# Patient Record
Sex: Male | Born: 1940 | Race: White | Hispanic: No | Marital: Single | Smoking: Former smoker
Health system: Southern US, Community
[De-identification: ages and names within clinical notes are randomized; demographics above are authoritative.]

## PROBLEM LIST (undated history)

## (undated) DIAGNOSIS — E785 Hyperlipidemia, unspecified: Secondary | ICD-10-CM

## (undated) DIAGNOSIS — Z85828 Personal history of other malignant neoplasm of skin: Secondary | ICD-10-CM

## (undated) DIAGNOSIS — Z7289 Other problems related to lifestyle: Secondary | ICD-10-CM

## (undated) DIAGNOSIS — I674 Hypertensive encephalopathy: Secondary | ICD-10-CM

## (undated) DIAGNOSIS — I10 Essential (primary) hypertension: Secondary | ICD-10-CM

## (undated) DIAGNOSIS — Z800 Family history of malignant neoplasm of digestive organs: Secondary | ICD-10-CM

## (undated) DIAGNOSIS — Z860100 Personal history of colon polyps, unspecified: Secondary | ICD-10-CM

## (undated) DIAGNOSIS — K5790 Diverticulosis of intestine, part unspecified, without perforation or abscess without bleeding: Secondary | ICD-10-CM

## (undated) DIAGNOSIS — Z8546 Personal history of malignant neoplasm of prostate: Secondary | ICD-10-CM

## (undated) DIAGNOSIS — I2510 Atherosclerotic heart disease of native coronary artery without angina pectoris: Secondary | ICD-10-CM

## (undated) DIAGNOSIS — F1090 Alcohol use, unspecified, uncomplicated: Secondary | ICD-10-CM

## (undated) HISTORY — DX: Hyperlipidemia, unspecified: E78.5

## (undated) HISTORY — DX: Essential (primary) hypertension: I10

## (undated) HISTORY — DX: Atherosclerotic heart disease of native coronary artery without angina pectoris: I25.10

## (undated) HISTORY — DX: Hypertensive encephalopathy: I67.4

## (undated) HISTORY — DX: Personal history of colon polyps, unspecified: Z86.0100

## (undated) HISTORY — DX: Personal history of malignant neoplasm of prostate: Z85.46

## (undated) HISTORY — DX: Diverticulosis of intestine, part unspecified, without perforation or abscess without bleeding: K57.90

## (undated) HISTORY — DX: Personal history of colonic polyps: Z86.010

## (undated) HISTORY — DX: Personal history of other malignant neoplasm of skin: Z85.828

## (undated) HISTORY — PX: COLONOSCOPY W/ POLYPECTOMY: SHX1380

## (undated) HISTORY — PX: TONSILLECTOMY: SUR1361

## (undated) HISTORY — DX: Family history of malignant neoplasm of digestive organs: Z80.0

---

## 2000-08-15 HISTORY — PX: PROSTATECTOMY: SHX69

## 2000-09-08 ENCOUNTER — Encounter (INDEPENDENT_AMBULATORY_CARE_PROVIDER_SITE_OTHER): Payer: Self-pay | Admitting: Specialist

## 2000-09-08 ENCOUNTER — Other Ambulatory Visit: Admission: RE | Admit: 2000-09-08 | Discharge: 2000-09-08 | Payer: Self-pay | Admitting: Urology

## 2000-12-11 ENCOUNTER — Encounter: Payer: Self-pay | Admitting: Urology

## 2000-12-18 ENCOUNTER — Inpatient Hospital Stay (HOSPITAL_COMMUNITY): Admission: RE | Admit: 2000-12-18 | Discharge: 2000-12-21 | Payer: Self-pay | Admitting: Urology

## 2000-12-18 ENCOUNTER — Encounter (INDEPENDENT_AMBULATORY_CARE_PROVIDER_SITE_OTHER): Payer: Self-pay

## 2002-07-02 ENCOUNTER — Ambulatory Visit: Admission: RE | Admit: 2002-07-02 | Discharge: 2002-09-19 | Payer: Self-pay | Admitting: Radiation Oncology

## 2003-07-03 ENCOUNTER — Encounter: Payer: Self-pay | Admitting: Internal Medicine

## 2003-07-04 ENCOUNTER — Encounter: Payer: Self-pay | Admitting: Internal Medicine

## 2004-08-18 ENCOUNTER — Ambulatory Visit: Payer: Self-pay | Admitting: Internal Medicine

## 2004-08-23 ENCOUNTER — Ambulatory Visit: Payer: Self-pay

## 2004-12-16 ENCOUNTER — Ambulatory Visit: Payer: Self-pay | Admitting: Internal Medicine

## 2005-12-07 ENCOUNTER — Ambulatory Visit: Payer: Self-pay | Admitting: Internal Medicine

## 2006-12-13 ENCOUNTER — Ambulatory Visit: Payer: Self-pay | Admitting: Internal Medicine

## 2006-12-26 ENCOUNTER — Ambulatory Visit: Payer: Self-pay | Admitting: Internal Medicine

## 2006-12-26 LAB — CONVERTED CEMR LAB
AST: 30 units/L (ref 0–37)
Albumin: 4.1 g/dL (ref 3.5–5.2)
Basophils Relative: 0.6 % (ref 0.0–1.0)
Bilirubin, Direct: 0.2 mg/dL (ref 0.0–0.3)
Chloride: 106 meq/L (ref 96–112)
Creatinine, Ser: 1.1 mg/dL (ref 0.4–1.5)
Eosinophils Relative: 4.8 % (ref 0.0–5.0)
Glucose, Bld: 94 mg/dL (ref 70–99)
HCT: 43.8 % (ref 39.0–52.0)
Hgb A1c MFr Bld: 5.2 % (ref 4.6–6.0)
Neutrophils Relative %: 48.7 % (ref 43.0–77.0)
RBC: 4.71 M/uL (ref 4.22–5.81)
RDW: 12.4 % (ref 11.5–14.6)
Sodium: 142 meq/L (ref 135–145)
Total Bilirubin: 1.1 mg/dL (ref 0.3–1.2)
Total CHOL/HDL Ratio: 4.7
Triglycerides: 108 mg/dL (ref 0–149)
WBC: 4.3 10*3/uL — ABNORMAL LOW (ref 4.5–10.5)

## 2007-04-17 ENCOUNTER — Ambulatory Visit: Payer: Self-pay | Admitting: Gastroenterology

## 2007-04-30 ENCOUNTER — Ambulatory Visit: Payer: Self-pay | Admitting: Gastroenterology

## 2007-04-30 ENCOUNTER — Encounter: Payer: Self-pay | Admitting: Internal Medicine

## 2008-07-17 ENCOUNTER — Ambulatory Visit: Payer: Self-pay | Admitting: Internal Medicine

## 2008-07-17 DIAGNOSIS — Z85828 Personal history of other malignant neoplasm of skin: Secondary | ICD-10-CM

## 2008-07-17 DIAGNOSIS — I119 Hypertensive heart disease without heart failure: Secondary | ICD-10-CM

## 2008-07-17 DIAGNOSIS — Z8546 Personal history of malignant neoplasm of prostate: Secondary | ICD-10-CM | POA: Insufficient documentation

## 2008-07-17 DIAGNOSIS — Z86010 Personal history of colonic polyps: Secondary | ICD-10-CM

## 2008-07-17 DIAGNOSIS — E785 Hyperlipidemia, unspecified: Secondary | ICD-10-CM | POA: Insufficient documentation

## 2008-07-17 LAB — CONVERTED CEMR LAB: LDL Goal: 130 mg/dL

## 2008-07-18 ENCOUNTER — Encounter: Payer: Self-pay | Admitting: Internal Medicine

## 2008-07-24 ENCOUNTER — Ambulatory Visit: Payer: Self-pay | Admitting: Internal Medicine

## 2008-07-30 LAB — CONVERTED CEMR LAB
ALT: 26 units/L (ref 0–53)
Albumin: 4.1 g/dL (ref 3.5–5.2)
Direct LDL: 156.8 mg/dL
HDL: 47.8 mg/dL (ref >39.0)
Hgb A1c MFr Bld: 5.4 % (ref 4.6–6.0)
Potassium: 4.3 meq/L (ref 3.5–5.1)
Total Bilirubin: 0.9 mg/dL (ref 0.3–1.2)
Triglycerides: 70 mg/dL (ref 0–149)

## 2008-07-31 ENCOUNTER — Telehealth (INDEPENDENT_AMBULATORY_CARE_PROVIDER_SITE_OTHER): Payer: Self-pay | Admitting: *Deleted

## 2008-07-31 ENCOUNTER — Encounter (INDEPENDENT_AMBULATORY_CARE_PROVIDER_SITE_OTHER): Payer: Self-pay | Admitting: *Deleted

## 2008-08-07 ENCOUNTER — Ambulatory Visit: Payer: Self-pay | Admitting: Internal Medicine

## 2008-08-07 LAB — CONVERTED CEMR LAB
Fecal Occult Blood: NEGATIVE
OCCULT 3: NEGATIVE
OCCULT 4: NEGATIVE
OCCULT 5: NEGATIVE

## 2008-08-11 ENCOUNTER — Encounter (INDEPENDENT_AMBULATORY_CARE_PROVIDER_SITE_OTHER): Payer: Self-pay | Admitting: *Deleted

## 2008-08-15 HISTORY — PX: CATARACT EXTRACTION: SUR2

## 2008-10-02 ENCOUNTER — Ambulatory Visit: Payer: Self-pay | Admitting: Internal Medicine

## 2008-10-19 LAB — CONVERTED CEMR LAB
ALT: 28 units/L (ref 0–53)
AST: 35 units/L (ref 0–37)
Alkaline Phosphatase: 63 units/L (ref 39–117)
Bilirubin, Direct: 0.1 mg/dL (ref 0.0–0.3)
Cholesterol: 177 mg/dL (ref 0–200)
Total Protein: 6.9 g/dL (ref 6.0–8.3)

## 2008-10-20 ENCOUNTER — Encounter (INDEPENDENT_AMBULATORY_CARE_PROVIDER_SITE_OTHER): Payer: Self-pay | Admitting: *Deleted

## 2009-08-01 ENCOUNTER — Observation Stay (HOSPITAL_COMMUNITY): Admission: EM | Admit: 2009-08-01 | Discharge: 2009-08-03 | Payer: Self-pay | Admitting: Emergency Medicine

## 2009-08-03 ENCOUNTER — Ambulatory Visit: Payer: Self-pay | Admitting: Vascular Surgery

## 2009-08-03 ENCOUNTER — Encounter (INDEPENDENT_AMBULATORY_CARE_PROVIDER_SITE_OTHER): Payer: Self-pay | Admitting: Internal Medicine

## 2009-08-04 ENCOUNTER — Ambulatory Visit: Payer: Self-pay | Admitting: Internal Medicine

## 2009-08-04 DIAGNOSIS — I674 Hypertensive encephalopathy: Secondary | ICD-10-CM

## 2009-09-07 ENCOUNTER — Ambulatory Visit: Payer: Self-pay | Admitting: Internal Medicine

## 2009-09-11 LAB — CONVERTED CEMR LAB
AST: 30 units/L (ref 0–37)
Cholesterol: 162 mg/dL (ref 0–200)
LDL Cholesterol: 104 mg/dL — ABNORMAL HIGH (ref 0–99)
Triglycerides: 98 mg/dL (ref 0.0–149.0)
VLDL: 19.6 mg/dL (ref 0.0–40.0)

## 2009-09-14 ENCOUNTER — Ambulatory Visit: Payer: Self-pay | Admitting: Internal Medicine

## 2010-03-15 ENCOUNTER — Ambulatory Visit: Payer: Self-pay | Admitting: Internal Medicine

## 2010-08-24 ENCOUNTER — Ambulatory Visit
Admission: RE | Admit: 2010-08-24 | Discharge: 2010-08-24 | Payer: Self-pay | Source: Home / Self Care | Attending: Internal Medicine | Admitting: Internal Medicine

## 2010-08-24 ENCOUNTER — Other Ambulatory Visit: Payer: Self-pay | Admitting: Internal Medicine

## 2010-08-24 LAB — CBC WITH DIFFERENTIAL/PLATELET
Basophils Absolute: 0 10*3/uL (ref 0.0–0.1)
Basophils Relative: 0.8 % (ref 0.0–3.0)
Eosinophils Absolute: 0.2 10*3/uL (ref 0.0–0.7)
Eosinophils Relative: 4.3 % (ref 0.0–5.0)
HCT: 43.7 % (ref 39.0–52.0)
Hemoglobin: 14.8 g/dL (ref 13.0–17.0)
Lymphocytes Relative: 35 % (ref 12.0–46.0)
Lymphs Abs: 1.5 10*3/uL (ref 0.7–4.0)
MCHC: 33.8 g/dL (ref 30.0–36.0)
MCV: 95.2 fl (ref 78.0–100.0)
Monocytes Absolute: 0.4 10*3/uL (ref 0.1–1.0)
Monocytes Relative: 8.4 % (ref 3.0–12.0)
Neutro Abs: 2.3 10*3/uL (ref 1.4–7.7)
Neutrophils Relative %: 51.5 % (ref 43.0–77.0)
Platelets: 215 10*3/uL (ref 150.0–400.0)
RBC: 4.59 Mil/uL (ref 4.22–5.81)
RDW: 13.4 % (ref 11.5–14.6)
WBC: 4.4 10*3/uL — ABNORMAL LOW (ref 4.5–10.5)

## 2010-08-24 LAB — LIPID PANEL
Cholesterol: 197 mg/dL (ref 0–200)
HDL: 59.5 mg/dL (ref >39.00)
LDL Cholesterol: 122 mg/dL — ABNORMAL HIGH (ref 0–99)
Total CHOL/HDL Ratio: 3
Triglycerides: 80 mg/dL (ref 0.0–149.0)
VLDL: 16 mg/dL (ref 0.0–40.0)

## 2010-08-24 LAB — CONVERTED CEMR LAB
Blood in Urine, dipstick: NEGATIVE
Protein, U semiquant: NEGATIVE
Urobilinogen, UA: 0.2
WBC Urine, dipstick: NEGATIVE

## 2010-08-24 LAB — BASIC METABOLIC PANEL
BUN: 23 mg/dL (ref 6–23)
CO2: 30 mEq/L (ref 19–32)
Calcium: 9 mg/dL (ref 8.4–10.5)
Chloride: 105 mEq/L (ref 96–112)
Creatinine, Ser: 0.9 mg/dL (ref 0.4–1.5)
GFR: 87.91 mL/min (ref >60.00)
Glucose, Bld: 91 mg/dL (ref 70–99)
Potassium: 5.3 mEq/L — ABNORMAL HIGH (ref 3.5–5.1)
Sodium: 143 mEq/L (ref 135–145)

## 2010-08-24 LAB — HEPATIC FUNCTION PANEL
ALT: 21 U/L (ref 0–53)
AST: 29 U/L (ref 0–37)
Albumin: 4 g/dL (ref 3.5–5.2)
Alkaline Phosphatase: 69 U/L (ref 39–117)
Bilirubin, Direct: 0.1 mg/dL (ref 0.0–0.3)
Total Bilirubin: 0.8 mg/dL (ref 0.3–1.2)
Total Protein: 6.7 g/dL (ref 6.0–8.3)

## 2010-08-24 LAB — TSH: TSH: 1.54 u[IU]/mL (ref 0.35–5.50)

## 2010-08-24 LAB — PSA: PSA: 0 ng/mL — ABNORMAL LOW (ref 0.10–4.00)

## 2010-08-31 ENCOUNTER — Ambulatory Visit
Admission: RE | Admit: 2010-08-31 | Discharge: 2010-08-31 | Payer: Self-pay | Source: Home / Self Care | Attending: Internal Medicine | Admitting: Internal Medicine

## 2010-08-31 ENCOUNTER — Encounter: Payer: Self-pay | Admitting: Internal Medicine

## 2010-08-31 DIAGNOSIS — K5730 Diverticulosis of large intestine without perforation or abscess without bleeding: Secondary | ICD-10-CM | POA: Insufficient documentation

## 2010-09-14 NOTE — Assessment & Plan Note (Signed)
Summary: TO DISCUSS LABS//PH   Vital Signs:  Patient profile:   70 year old male Weight:      222.8 pounds Pulse rate:   60 / minute Resp:     15 per minute BP sitting:   122 / 74  (left arm) Cuff size:   large  Vitals Entered By: Shonna Chock (September 14, 2009 11:06 AM) CC: Follow-up visit: Discuss Labs(copy given), Lipid Management, Hypertension Management Comments REVIEWED MED LIST, PATIENT AGREED DOSE AND INSTRUCTION CORRECT    CC:  Follow-up visit: Discuss Labs(copy given), Lipid Management, and Hypertension Management.  History of Present Illness: Labs reviewed & risks discussed. BP is progressively dropping, but 140/77 range @ home. Weight down 15# with decreased alcohol & decreased calories.  CVE 5X/week as walking 4 mpd or [employer] machines for 35-45 min  w/o symptoms.  Hypertension History:      He denies headache, chest pain, palpitations, dyspnea with exertion, orthopnea, PND, peripheral edema, visual symptoms, neurologic problems, syncope, and side effects from treatment.  He notes no problems with any antihypertensive medication side effects.  Cataract will need to be addressed.        Positive major cardiovascular risk factors include male age 77 years old or older, hyperlipidemia, and hypertension.  Negative major cardiovascular risk factors include no history of diabetes, negative family history for ischemic heart disease, and non-tobacco-user status.        Further assessment for target organ damage reveals no history of ASHD, stroke/TIA, or peripheral vascular disease.    Lipid Management History:      Positive NCEP/ATP III risk factors include male age 53 years old or older, HDL cholesterol less than 40, and hypertension.  Negative NCEP/ATP III risk factors include non-diabetic, no family history for ischemic heart disease, non-tobacco-user status, no ASHD (atherosclerotic heart disease), no prior stroke/TIA, no peripheral vascular disease, and no history of  aortic aneurysm.      Allergies (verified): No Known Drug Allergies  Review of Systems Eyes:  Complains of vision loss-1 eye; denies blurring and double vision. Neuro:  Denies numbness and tingling.  Physical Exam  General:  well-nourished, alert,appropriate and cooperative throughout examination Heart:  Normal rate and regular rhythm. S1 and S2 normal without gallop, murmur, click, rub. Pulses:  R and L carotid,radial,dorsalis pedis and posterior tibial pulses are full and equal bilaterally Neurologic:  alert & oriented X3.   Psych:  Focused & intelligent   Impression & Recommendations:  Problem # 1:  HYPERTENSION (ICD-401.9) BP control improved His updated medication list for this problem includes:    Metoprolol Tartrate 100 Mg Tabs (Metoprolol tartrate) .Marland Kitchen... 1/2  two times a day    Losartan Potassium 100 Mg Tabs (Losartan potassium) .Marland Kitchen... 1 once daily  Problem # 2:  HYPERLIPIDEMIA (ICD-272.4) LDL @ goal His updated medication list for this problem includes:    Pravastatin Sodium 20 Mg Tabs (Pravastatin sodium) .Marland Kitchen... 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 1/2  two times a day 2)  Pravastatin Sodium 20 Mg Tabs (Pravastatin sodium) .Marland Kitchen.. 1 qhs 3)  Losartan Potassium 100 Mg Tabs (Losartan potassium) .Marland Kitchen.. 1 once daily  Hypertension Assessment/Plan:      The patient's hypertensive risk group is category B: At least one risk factor (excluding diabetes) with no target organ damage.  His calculated 10 year risk of coronary heart disease is 18 %.  Today's blood pressure is 122/74.    Lipid Assessment/Plan:      Based on NCEP/ATP III, the patient's risk factor category is "2 or more risk factors and a calculated 10 year CAD risk of < 20%".  The patient's lipid goals are as follows: Total cholesterol goal is 200; LDL cholesterol goal is 130; HDL cholesterol goal  is 40; Triglyceride goal is 150.  His LDL cholesterol goal has been met.  Secondary causes for hyperlipidemia have been ruled out.  He has been counseled on adjunctive measures for lowering his cholesterol and has been provided with dietary instructions.    Patient Instructions: 1)  Please schedule a follow-up appointment in 6 months. 2)  NMR Lipoprofile Lipid Panel prior to visit, ICD-9:272.4 3)  HbgA1C prior to visit, ICD-9:272.4 Prescriptions: LOSARTAN POTASSIUM 100 MG TABS (LOSARTAN POTASSIUM) 1 once daily  #90 x 1   Entered and Authorized by:   Marga Melnick MD   Signed by:   Marga Melnick MD on 09/14/2009   Method used:   Print then Give to Patient   RxID:   [id number] PRAVASTATIN SODIUM 20 MG TABS (PRAVASTATIN SODIUM) 1 qhs  #90 Tablet x 3   Entered and Authorized by:   Marga Melnick MD   Signed by:   Marga Melnick MD on 09/14/2009   Method used:   Print then Give to Patient   RxID:   [id number]

## 2010-09-16 NOTE — Assessment & Plan Note (Signed)
Summary: CPX,LABS PRIOR,MEDICARE & UHC/RH.....   Vital Signs:  Patient profile:   70 year old male Height:      68 inches Weight:      221.2 pounds BMI:     33.75 Temp:     98.5 degrees F oral Pulse rate:   56 / minute Resp:     14 per minute BP sitting:   136 / 90  (left arm) Cuff size:   large  Vitals Entered By: Shonna Chock CMA (August 31, 2010 2:00 PM) CC: CPX and discuss labs (copy given) , Lipid Management  Vision Screening:Left eye w/o correction: 20 / 20 Right Eye w/o correction: 20 / 20 Both eyes w/o correction:  20/ 20        Vision Entered By: Shonna Chock CMA (August 31, 2010 2:07 PM)   CC:  CPX and discuss labs (copy given)  and Lipid Management.  History of Present Illness: Here for Medicare AWV: 1.Risk factors based on Past M, S, F history:see Diagnoses ; chart updated 2.Physical Activities: walking 6X/week > 60 min 3.Depression/mood: no issues 4.Hearing: whisper heard @ 6 ft 5.ADL's: no limitations 6.Fall Risk: no issues 7.Home Safety: safety proofed 8.Height, weight, &visual acuity:see VS 9.Counseling: POA & Living Will in place 10.Labs ordered based on risk factors: reviewed & risk discussed 11. Referral Coordination: none requested 12.Care Plan: see Instructions 13. Cognitive Assessment: Oriented X 3 ; memory & recall   ; "WORLD " spelled backwards ; mood & affect.    Hyperlipidemia Follow-Up:The patient denies muscle aches, GI upset, abdominal pain, flushing, itching, constipation, diarrhea, and fatigue.  Other symptoms include occasional  pedal edema.  The patient denies the following symptoms: chest pain/pressure, exercise intolerance, dypsnea, palpitations, and syncope.  Compliance with medications (by patient report) has been near 100%.  Dietary compliance has been excellent.  Adjunctive measures currently used by the patient include fiber and ASA.     Hypertension Follow-Up: The patient denies lightheadedness, urinary frequency, and  headaches.  Compliance with medications (by patient report) has been near 100%.  Adjunctive measures currently used by the patient include salt restriction. BP not monitored @ home.   Lipid Management History:      Positive NCEP/ATP III risk factors include male age 59 years old or older, HDL cholesterol less than 40, and hypertension.  Negative NCEP/ATP III risk factors include non-diabetic, no family history for ischemic heart disease, non-tobacco-user status, no ASHD (atherosclerotic heart disease), no prior stroke/TIA, no peripheral vascular disease, and no history of aortic aneurysm.     Preventive Screening-Counseling & Management  Alcohol-Tobacco     Alcohol drinks/day: 1-2     Packs/Day: 1.0     Year Started: 1960     Year Quit: 1980  Caffeine-Diet-Exercise     Caffeine use/day: 2 cups/ day  Hep-HIV-STD-Contraception     Dental Visit-last 6 months yes     Sun Exposure-Excessive: no  Safety-Violence-Falls     Seat Belt Use: yes     Smoke Detectors: yes      Blood Transfusions:  no.        Travel History:  Oct 2011 [place].    Current Medications (verified): 1)  Metoprolol Tartrate 100 Mg Tabs (Metoprolol Tartrate) .... 1/2  Two Times A Day 2)  Pravastatin Sodium 20 Mg Tabs (Pravastatin Sodium) .Marland Kitchen.. 1 Qhs 3)  Losartan Potassium 100 Mg Tabs (Losartan Potassium) .Marland Kitchen.. 1 Once Daily  Allergies (verified): No Known Drug Allergies  Past History:  Past Medical  History: Femoral bruits Prostate cancer, PMH  of, Dr Vonita Moss Colonic polyps, PMH  of, Dr Jarold Motto  Hypertension Skin cancer, PMH of,  Basal Cell,Dr Turner Hypertensive Encephalopathy 12/18-12/20/2010 Diverticulosis, colon Hyperlipidemia: NMR Lipoprofile 2011: LDL 103 (1410/120), HDL 73, TG 76. Framingham Study LDL goal = < 130.  Past Surgical History: Colon polypectomy 2000, neg 2003 & 2008, Dr Jarold Motto (due 2013) Prostatectomy 2002, Radiation treatments  2002-3, Dr Vonita Moss; Tonsillectomy Cataract  extraction OD, Dr Hazle Quant  Family History: Father:AF  Mother: colon  cancer  Siblings: bro: prostate  cancer; MGF :MI @ 42; MGM: pancreatic  cancer ;PGF: stomach cancer  Social History: Occupation: Executive/President Married Alcohol use-yes Regular exercise-yes No  specific dietPacks/Day:  1.0 Caffeine use/day:  2 cups/ day Dental Care w/in 6 mos.:  yes Sun Exposure-Excessive:  no Seat Belt Use:  yes Blood Transfusions:  no  Review of Systems  The patient denies anorexia, fever, hoarseness, prolonged cough, hemoptysis, melena, hematochezia, severe indigestion/heartburn, hematuria, suspicious skin lesions, depression, abnormal bleeding, enlarged lymph nodes, and angioedema.         Weight down 18 #  in past year  Physical Exam  General:  well-nourished;alert,appropriate and cooperative throughout examination Head:  Normocephalic and atraumatic without obvious abnormalities. No apparent alopecia Eyes:  No corneal or conjunctival inflammation noted.  Perrla. Funduscopic exam benign, without hemorrhages, exudates or papilledema.  Ears:  External ear exam shows no significant lesions or deformities.  Otoscopic examination reveals clear canals, tympanic membranes are intact bilaterally without bulging, retraction, inflammation or discharge. Hearing is grossly normal bilaterally. Nose:  External nasal examination shows no deformity or inflammation. Nasal mucosa are pink and moist without lesions or exudates. Mouth:  Oral mucosa and oropharynx without lesions or exudates.  Teeth in good repair. Neck:  No deformities, masses, or tenderness noted. Lungs:  Normal respiratory effort, chest expands symmetrically. Lungs are clear to auscultation, no crackles or wheezes. Heart:  regular rhythm, no gallop, no rub, no JVD, no HJR, bradycardia, and intermittent  grade1/2-1   /6 systolic murmur @ base.   Abdomen:  Bowel sounds positive,abdomen soft and non-tender without masses, organomegaly or  hernias noted. Genitalia:  Dr Vernona Rieger Msk:  No deformity or scoliosis noted of thoracic or lumbar spine.   Pulses:  R and L carotid,radial,dorsalis pedis and posterior tibial pulses are full and equal bilaterally Extremities:  trace left pedal edema and trace right pedal edema.   Neurologic:  alert & oriented X3 and DTRs symmetrical and normal.   Skin:  Intact without suspicious lesions or rashes Cervical Nodes:  No lymphadenopathy noted Axillary Nodes:  No palpable lymphadenopathy Psych:  memory intact for recent and remote, normally interactive, and good eye contact.     Impression & Recommendations:  Problem # 1:  PREVENTIVE HEALTH CARE (ICD-V70.0)  Orders: Medicare -1st Annual Wellness Visit [phone])  Problem # 2:  HYPERTENSION (ICD-401.9)  His updated medication list for this problem includes:    Metoprolol Tartrate 100 Mg Tabs (Metoprolol tartrate) .Marland Kitchen... 1/2  two times a day    Losartan Potassium 100 Mg Tabs (Losartan potassium) .Marland Kitchen... 1 once daily  Orders: EKG w/ Interpretation (93000)  Problem # 3:  HYPERLIPIDEMIA (ICD-272.4)  His updated medication list for this problem includes:    Pravastatin Sodium 20 Mg Tabs (Pravastatin sodium) .Marland Kitchen... 1 qhs  Problem # 4:  COLONIC POLYPS, HX OF (ICD-V12.72) as per Dr Jarold Motto  Problem # 5:  PROSTATE CANCER, HX OF (ICD-V10.46) as per Dr Vonita Moss  Complete Medication  List: 1)  Metoprolol Tartrate 100 Mg Tabs (Metoprolol tartrate) .... 1/2  two times a day 2)  Pravastatin Sodium 20 Mg Tabs (Pravastatin sodium) .Marland Kitchen.. 1 qhs 3)  Losartan Potassium 100 Mg Tabs (Losartan potassium) .Marland Kitchen.. 1 once daily 4)  Aspirin 81 Mg Tabs (Aspirin) .Marland Kitchen.. 1 by mouth once daily  Lipid Assessment/Plan:      Based on NCEP/ATP III, the patient's risk factor category is "0-1 risk factors".  The patient's lipid goals are as follows: Total cholesterol goal is 200; LDL cholesterol goal is 130; HDL cholesterol goal is 40; Triglyceride goal is 150.  His LDL  cholesterol goal has been met.  Secondary causes for hyperlipidemia have been ruled out.  He has been counseled on adjunctive measures for lowering his cholesterol and has been provided with dietary instructions.    Patient Instructions: 1)  It is important that you exercise regularly at least 20 minutes 5 times a week. If you develop chest pain, have severe difficulty breathing, or feel very tired , stop exercising immediately and seek medical attention. 2)  Schedule a colonoscopy in 2013  to help detect colon cancer. 3)  Take an Aspirin every day. 4)  Check your Blood Pressure regularly. If it is above: 135/85 ON AVERAGE  you should make an appointment. 5)  Please schedule a follow-up appointment in 6 months. 6)   [facility] Lipid Panel  2 weeks prior to visit, ICD-9:272.4 Prescriptions: LOSARTAN POTASSIUM 100 MG TABS (LOSARTAN POTASSIUM) 1 once daily  #90 x 3   Entered and Authorized by:   Marga Melnick MD   Signed by:   Marga Melnick MD on 08/31/2010   Method used:   Print then Give to Patient   RxID:   [id number] PRAVASTATIN SODIUM 20 MG TABS (PRAVASTATIN SODIUM) 1 qhs  #90 Tablet x 3   Entered and Authorized by:   Marga Melnick MD   Signed by:   Marga Melnick MD on 08/31/2010   Method used:   Print then Give to Patient   RxID:   [id number] METOPROLOL TARTRATE 100 MG TABS (METOPROLOL TARTRATE) 1/2  two times a day  #90 Tablet x 3   Entered and Authorized by:   Marga Melnick MD   Signed by:   Marga Melnick MD on 08/31/2010   Method used:   Print then Give to Patient   RxID:   [id number]    Orders Added: 1)  Medicare -1st Annual Wellness Visit [G0438] 2)  Est. Patient Level III [01027] 3)  EKG w/ Interpretation [93000]

## 2010-11-15 LAB — DIFFERENTIAL
Basophils Absolute: 0.1 10*3/uL (ref 0.0–0.1)
Basophils Relative: 2 % — ABNORMAL HIGH (ref 0–1)
Eosinophils Absolute: 0 10*3/uL (ref 0.0–0.7)
Eosinophils Relative: 0 % (ref 0–5)
Lymphocytes Relative: 25 % (ref 12–46)
Monocytes Absolute: 0.6 10*3/uL (ref 0.1–1.0)

## 2010-11-15 LAB — RAPID URINE DRUG SCREEN, HOSP PERFORMED
Benzodiazepines: NOT DETECTED
Cocaine: NOT DETECTED
Opiates: NOT DETECTED
Tetrahydrocannabinol: NOT DETECTED

## 2010-11-15 LAB — POCT I-STAT, CHEM 8
BUN: 22 mg/dL (ref 6–23)
Calcium, Ion: 1.07 mmol/L — ABNORMAL LOW (ref 1.12–1.32)
Chloride: 104 mEq/L (ref 96–112)
Creatinine, Ser: 1 mg/dL (ref 0.4–1.5)
Glucose, Bld: 103 mg/dL — ABNORMAL HIGH (ref 70–99)
TCO2: 27 mmol/L (ref 0–100)

## 2010-11-15 LAB — COMPREHENSIVE METABOLIC PANEL
ALT: 25 U/L (ref 0–53)
AST: 36 U/L (ref 0–37)
CO2: 25 mEq/L (ref 19–32)
Calcium: 9.3 mg/dL (ref 8.4–10.5)
Chloride: 101 mEq/L (ref 96–112)
GFR calc non Af Amer: >60 mL/min (ref >60)
Glucose, Bld: 106 mg/dL — ABNORMAL HIGH (ref 70–99)
Sodium: 137 mEq/L (ref 135–145)
Total Bilirubin: 0.6 mg/dL (ref 0.3–1.2)

## 2010-11-15 LAB — URINALYSIS, ROUTINE W REFLEX MICROSCOPIC
Bilirubin Urine: NEGATIVE
Hgb urine dipstick: NEGATIVE
Ketones, ur: NEGATIVE mg/dL
Nitrite: NEGATIVE
Specific Gravity, Urine: 1.013 (ref 1.005–1.030)
Urobilinogen, UA: 0.2 mg/dL (ref 0.0–1.0)

## 2010-11-15 LAB — GLUCOSE, CAPILLARY: Glucose-Capillary: 94 mg/dL (ref 70–99)

## 2010-11-15 LAB — CK TOTAL AND CKMB (NOT AT ARMC): Relative Index: 2.9 — ABNORMAL HIGH (ref 0.0–2.5)

## 2010-11-15 LAB — CBC
HCT: 45.4 % (ref 39.0–52.0)
Hemoglobin: 15.6 g/dL (ref 13.0–17.0)
MCV: 93.7 fL (ref 78.0–100.0)
Platelets: 311 10*3/uL (ref 150–400)
RDW: 12.4 % (ref 11.5–15.5)
WBC: 7.4 10*3/uL (ref 4.0–10.5)

## 2010-11-15 LAB — TSH: TSH: 1.421 u[IU]/mL (ref 0.350–4.500)

## 2010-11-15 LAB — PROTIME-INR: Prothrombin Time: 12 seconds (ref 11.6–15.2)

## 2010-11-15 LAB — CARDIAC PANEL(CRET KIN+CKTOT+MB+TROPI)
CK, MB: 3 ng/mL (ref 0.3–4.0)
CK, MB: 4.3 ng/mL — ABNORMAL HIGH (ref 0.3–4.0)
Relative Index: 1.7 (ref 0.0–2.5)
Relative Index: 2.3 (ref 0.0–2.5)
Total CK: 187 U/L (ref 7–232)
Troponin I: 0.03 ng/mL (ref 0.00–0.06)

## 2010-11-15 LAB — TROPONIN I

## 2010-11-15 LAB — FOLATE RBC: RBC Folate: 726 ng/mL — ABNORMAL HIGH (ref 180–600)

## 2010-12-28 NOTE — Assessment & Plan Note (Signed)
[facility]                        [place] OFFICE NOTE   NAME:Russell [name], Russell [name]                    MRN:          [id number]  DATE:12/13/2006                            DOB:          1941-04-06    Mr. Eidem was seen December 13, 2006 for a comprehensive physical exam.  He is essentially asymptomatic.  He was found to have a cataract on the  right and is being followed by an Ophthalmologist.   His past medical history is unchanged.  He has had tonsillectomy;  prostatectomy in 2002, followed by radiation treatments (33).  Colonoscopy in 2000 revealed polyp in the hepatic flexure. Repeat  colonoscopy in 2003 was negative; due for followup this past year.   His medical problems include hypertension.   His mother had colon cancer,maternal grandfather had heart  attack,maternal grandmother had pancreatic cancer,paternal grandfather  had stomach cancer.  Brother had prostate cancer.   He has not smoked since 1980.  He drinks socially.  He walks 5 days a  week very vigorously with no cardiopulmonary symptoms.  He has no  constitutional symptoms.  He states, in fact, he has gained  approximately 4 pounds.  He has been on no specific diet.   He is only on enteric-coated aspirin, Toprol 100 mg 1/2 daily and HCTZ  25 mg 1/2 daily.  He has no known drug allergies.   REVIEW OF SYSTEMS:  Was completed in toto and was negative.  He is  followed by Larey Dresser every 6 months & was recently seen.   PHYSICAL EXAMINATION:  VITAL SIGNS:  Height is 5 feet 9-1/2, weight  226.8 fully clothed.  Pulse is 64, respiratory rate 14, and blood  pressure 120/84.  A cataract is visible on the right; arteriolar narrowing is noted.  Dental hygiene is excellent,nares  clear.  Otolaryngologic exam is  unremarkable.  NECK:  Thyroid is normal to palpation.  He has no lymphadenopathy.  HEART:  An S4 with slurring was noted.  There were no significant  murmurs.  All  pulses are intact.  No edema.  CHEST:  Clear to auscultation, and there is no organomegaly or masses.  EXTREMITIES:  There is some crepitus of the knees.  There is no  decreased range of motion and no effusions.  SKIN: Clear without any significant lesions.  NEURO/PSYCHIATRIC:  Normal.   His EKG shows  left axis deviation with left ventricular hypertrophy  probably related to hypertension.   In reviewing the chart, his LDL should be less than 125.  A goal LDL  sheet was provided; A1c will also be checked because of the weight gain.  There is no family history of diabetes.  Additionally, he has had no  polyuria, polyphagia, polydipsia, nonhealing skin lesions, or  paresthesias.   He has been referred to the web site, [employer].com The Flat Belly  Diet.  Further recommendations are pending return of these labs.     Titus Dubin. Alwyn Ren, MD,FACP,FCCP  Electronically Signed    [facility]  DD: 12/13/2006  DT: 12/13/2006  Job #: [id number]

## 2010-12-31 NOTE — Discharge Summary (Signed)
[facility]  Patient:    Russell [name], Russell [name]                    MRN: [id number] Adm. Date:  [id number] Disc. Date: 12/21/00 Attending:  Lauree Chandler CC:         Titus Dubin. Alwyn Ren, M.D. [facility]   Discharge Summary  FINAL DIAGNOSES: 1. Prostatic carcinoma. 2. Hypertension.  PROCEDURE:  Radical retropubic prostatectomy and bilateral pelvic lymph node dissection, Dec 18, 2000.  HISTORY OF PRESENT ILLNESS:  This 70 year old white male had a PSA of 3.0 that had risen from 1.5 two years ago.  That was in November 2001.  A significant factor was that his brother who is 62 years old had a PSA of 4.3 and had prostate cancer diagnosed in [place], [place].  Because of that, I biopsied the patient and he had Gleason grade 6 carcinoma.  He was counseled about therapy and brought to this facility for surgical cure.  Physical exam was unremarkable.  HOSPITAL COURSE:  After admission, he underwent a radical retropubic prostatectomy.  He did have some significant pelvic vasculature and difficult dissection because of a retropubic prostate.  He did receive a couple units of packed red cells.  But, other than that, his postoperative course was quite benign.  He ambulated well and advanced his diet well.  He remained afebrile. His leg wound drainage tailed off quite nicely.  His urine was clear and he was afebrile and ready for discharge on Dec 21, 2000.  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  He will go home and use Toprol XL for hypertension and use Vioxx for pain.  I also gave him a prescription for Tylox.  He will go home on limited activity.  He will go home with a Foley catheter in place, connected to the leg bag.  His Blake drain was removed on the day of discharge.  He will return to the office next week for skin staple removal.  He  was discharged in good condition. DD:  12/21/00 TD:  12/21/00 Job: [id number]

## 2010-12-31 NOTE — Op Note (Signed)
[facility]  Patient:    Russell [name], Russell [name]                    MRN: [id number] Proc. Date: 12/18/00 Adm. Date:  [id number] Attending:  Lauree Chandler CC:         Titus Dubin. Alwyn Ren, M.D. [facility]   Operative Report  PREOPERATIVE DIAGNOSIS:  Carcinoma of the prostate.  POSTOPERATIVE DIAGNOSIS:  Carcinoma of the prostate.  PROCEDURE:  Radical retropubic prostatectomy and bilateral pelvic lymph node dissection.  SURGEON:  Maretta Bees. Vonita Moss, M.D.  ASSISTANT:  Lucrezia Starch. Ovidio Hanger, M.D.  ANESTHESIA:  General.  INDICATIONS:  This 70 year old white male had a rising PSA up to just 3.0, but in view of the fact that he had a 48 year old brother with prostatic carcinoma, he underwent biopsy, not because of a grossly elevated PSA but because of a significant change over the last two years when he started out with a PSA in 1999 of 1.5.  He had bilateral Gleason grade 6 carcinoma.  He is brought to the OR for radical retropubic prostatectomy and lymph node dissection.  DESCRIPTION OF PROCEDURE:  The patient was brought to the operating room and placed in the supine position.  The abdomen and external genitalia were prepped and draped in the usual fashion.  A 24 French 30 cc Foley was inserted.  Midline lower abdominal incision was made and the rectus fascia divided in the midline.  The bilateral pelvic retroperitoneal spaces were dissected out.  The right obturator lymph node packet was dissected out and was fairly defined but adherent lymph node packet that required some tedious dissection and clipping of several lymphatic vessels.  The obturator nerve was identified, and the lymph node packet was removed intact, leaving the major vessels and obturator nerve unharmed.  The left obturator lymph node packet likewise was tediously dissected out, and there was a branch of the iliac vein that was ligated with a 3-0 black silk.  He had a very android pelvic with  the prostate well under the symphysis pubis.  There were large prostatic surface veins, and the endopelvic fascia was taken down bilaterally.  The dorsal vein complex was well under the pubic bone, but I was able to successfully get a suture around this with this with the Hohenfeltner clamp and ligate this with the Vicryl tie.  Later I did put in a figure-of-eight suture of 2-0 Vicryl in the dorsal vein complex.  There were large feeder veins that required some separate ligation and more than usual amount of bleeding because of this particular vascular and deep location of the dissection.  The urethra was divided just beyond the apex of the prostate, and the apical section of the prostate came up nicely out of the pelvis.  Denonvilliers fascia was dissected out and the prostate brought up off the rectum using right angle clips to ligate the prostatic pedicles bilaterally.  Around the bladder neck, there were several large venous blood vessels that required some suture ligation to successfully divide them.  The posterior aspect of the seminal vesicles were dissected out, and then the anterior bladder neck was opened up, and the ureteral orifices were noted to excrude the indigo carmine stained urine. There was a median lobe, and I used cautery to come across the mucosa and then elevate it and bluntly dissect off the median lobe to stay well away from the ureteral orifices.  A nice plane was developed between the posterior prostate  and the bladder, and then the vas on each side of the seminal vesicles were dissected out and the vascular divided and clipped with metal hemoclips, and the tips of the seminal vesicles were divided and ligated with metal hemoclips.  The specimen was removed intact and looked like a very good dissection.  The mucosa of the bladder was then reapproximated to the adjacent muscle with interrupted 4-0 chromic catgut.  The posterior bladder neck was closed in tennis  racquet fashion with running #2-0 chronic.  A 22 French 5 cc Foley was then passed per urethra, and then the bladder neck and urethra anastomosis was completed using 2-0 Vicryl at 2, 5, 7, 10, and 12 oclock. Before the Foley catheter was placed in the bladder, a #1 Prolene was tied through the tip and brought out through the anterior bladder wall and through the abdominal wall and tied to button its skin level at the right site of the right incision.  The ______  sutures were tied down and the bladder irrigated and was returning clear irrigation.  Through a stab wound to the left of the incision, a large Blake drain was placed for postoperative wound drainage, and the drain was sutured in place in black silk at skin level and connected to bulb suction.  The wound was irrigated with triple antibiotic solution.  The 15 cc of water had been placed in the Foley balloon with balloon placed on traction.  The rectus fascia was closed with running #1 PDS and subcutaneous irrigated, and the skin closed with skin staples.  Sponge, needle and instrument counts were correct.  Estimated blood loss was 1300 cc.  He received one unit of packed cells during the procedure.  He was taken to the recovery room in good condition after cleaning the wound and dressing it with dry sterile gauze dressings. DD:  12/18/00 TD:  12/16/00 Job: [id number]

## 2010-12-31 NOTE — H&P (Signed)
[facility]  Patient:    Russell [name], Russell [name]                    MRN: [id number] Adm. Date:  [id number] Attending:  Lauree Chandler                         History and Physical  HISTORY OF PRESENT ILLNESS:  This 70 year old white male was referred to me by Dr. Marga Melnick on August 16, 2000, and he had a PSA that had arose from 1.5 in 1999 to 2.6 in 2001 and 3.0 on July 13, 2000.  He has had mild symptoms of prostatism.  He has a 79 year old brother who was diagnosed with having prostate cancer with a PSA of 4.3 and underwent radical prostatectomy since then.  He had a prostate ultrasound and biopsy and it showed bilateral Gleason grade 6 carcinoma.  He was counseled about curative therapies and opted for radical retropubic prostatectomy.  He had a preoperative chest x-ray that raised a question of a diaphragmatic hernia that was confirmed by comparing it to an old chest x-ray.  PAST MEDICAL HISTORY:  He has a past history of hypertension.  MEDICATIONS:  Toprol XL 50 q.d.  ALLERGIES:  No known drug allergies.  PAST SURGICAL HISTORY:  He has had a T&A in the past.  FAMILY HISTORY:  As noted above.  SOCIAL HISTORY:  He does not smoke.  He drinks alcohol socially.  REVIEW OF SYSTEMS:  Noted on health history form.  PHYSICAL EXAMINATION:  GENERAL:  He is alert and oriented.  He appears younger than stated age in no acute distress.  SKIN:  Warm and dry.  NECK:  Supple.  LUNGS:  No respiratory distress.  HEART:  Heart tones regular.  ABDOMEN:  Abdomen soft and nontender.  EXTERNAL GENITALIA:  Normal.  PROSTATE:  Benign and smooth.  EXTREMITIES:  No edema.  IMPRESSION: 1. Prostatic carcinoma. 2. Hypertension. DD:  12/18/00 TD:  12/18/00 Job: [id number]

## 2011-09-13 ENCOUNTER — Ambulatory Visit (INDEPENDENT_AMBULATORY_CARE_PROVIDER_SITE_OTHER): Payer: Medicare Other | Admitting: Internal Medicine

## 2011-09-13 ENCOUNTER — Encounter: Payer: Self-pay | Admitting: Internal Medicine

## 2011-09-13 VITALS — BP 130/82 | HR 59 | Temp 98.4°F | Resp 12 | Ht 68.75 in | Wt 230.4 lb

## 2011-09-13 DIAGNOSIS — Z23 Encounter for immunization: Secondary | ICD-10-CM

## 2011-09-13 DIAGNOSIS — Z86010 Personal history of colonic polyps: Secondary | ICD-10-CM

## 2011-09-13 DIAGNOSIS — E785 Hyperlipidemia, unspecified: Secondary | ICD-10-CM

## 2011-09-13 DIAGNOSIS — I10 Essential (primary) hypertension: Secondary | ICD-10-CM

## 2011-09-13 DIAGNOSIS — Z0000 Encounter for general adult medical examination without abnormal findings: Secondary | ICD-10-CM

## 2011-09-13 DIAGNOSIS — Z8546 Personal history of malignant neoplasm of prostate: Secondary | ICD-10-CM

## 2011-09-13 LAB — PSA: PSA: 0 ng/mL — ABNORMAL LOW (ref 0.10–4.00)

## 2011-09-13 LAB — TSH: TSH: 1.27 u[IU]/mL (ref 0.35–5.50)

## 2011-09-13 LAB — HEPATIC FUNCTION PANEL
ALT: 23 U/L (ref 0–53)
Albumin: 4.1 g/dL (ref 3.5–5.2)
Alkaline Phosphatase: 66 U/L (ref 39–117)
Bilirubin, Direct: 0.1 mg/dL (ref 0.0–0.3)
Total Protein: 6.6 g/dL (ref 6.0–8.3)

## 2011-09-13 LAB — BASIC METABOLIC PANEL
CO2: 28 mEq/L (ref 19–32)
Chloride: 104 mEq/L (ref 96–112)
Creatinine, Ser: 1 mg/dL (ref 0.4–1.5)
Sodium: 139 mEq/L (ref 135–145)

## 2011-09-13 LAB — CBC WITH DIFFERENTIAL/PLATELET
Basophils Relative: 1 % (ref 0.0–3.0)
Eosinophils Relative: 2.7 % (ref 0.0–5.0)
Hemoglobin: 15 g/dL (ref 13.0–17.0)
Lymphocytes Relative: 36.3 % (ref 12.0–46.0)
MCV: 94.4 fl (ref 78.0–100.0)
Monocytes Absolute: 0.5 10*3/uL (ref 0.1–1.0)
Neutro Abs: 2.6 10*3/uL (ref 1.4–7.7)
Neutrophils Relative %: 50.5 % (ref 43.0–77.0)
RBC: 4.61 Mil/uL (ref 4.22–5.81)
WBC: 5.2 10*3/uL (ref 4.5–10.5)

## 2011-09-13 MED ORDER — LOSARTAN POTASSIUM 100 MG PO TABS: 100.0000 mg | ORAL_TABLET | Freq: Every day | ORAL | Status: DC

## 2011-09-13 MED ORDER — PRAVASTATIN SODIUM 20 MG PO TABS: 20.0000 mg | ORAL_TABLET | Freq: Every day | ORAL | Status: DC

## 2011-09-13 MED ORDER — METOPROLOL TARTRATE 100 MG PO TABS: ORAL_TABLET | ORAL | Status: DC

## 2011-09-13 NOTE — Progress Notes (Signed)
Subjective:    Patient ID: Russell [name], male    DOB: 12/08/1940, 71 y.o.   MRN: [id number]  HPI Medicare Wellness Visit:  The following psychosocial & medical history were reviewed as required by Medicare.   Social history: caffeine:2 cups coffee/ day , alcohol:  10 / week ,  tobacco use : quit 1980  & exercise : walking & fitness center 5X/ week 30-60 min.   Home & personal  safety / fall risk: no issues, activities of daily living: no limitations, seatbelt use : yes, and smoke alarm employment :yes .  Power of Attorney/Living Will status : in place  Vision ( as recorded per Nurse) & Hearing  evaluation :  Ophth exam pending; whisper heard . Orientation :oriented X 3 , memory & recall :good,  math testing: good,and mood & affect :normal  . Depression / anxiety: denied Travel history : 11/12 [place] , immunization status :Shingles needed , transfusion history:  no, and preventive health surveillance ( colonoscopies, BMD , etc as per protocol/ [facility]): colonoscopy up to date, Dental care:  Seen every 6 months. Chart reviewed &  Updated. Active issues reviewed & addressed.       Review of Systems HYPERTENSION: Disease Monitoring: Blood pressure range-not checked  Chest pain, palpitations- no     Dyspnea- no Medications: Compliance- yes  Lightheadedness,Syncope- no   Edema- occasionally L foot   HYPERLIPIDEMIA: Disease Monitoring: See symptoms for Hypertension Medications: Compliance- yes Abd pain, bowel changes-no  Muscle aches- no  His urologist has retired. He was having PSAs every 6 months. These have always been undetectable. He denies dysuria, hematuria, pyuria.  He also denies polyuria, polydipsia, or polyphagia.H ce has not had hypoglycemia.          Objective:   Physical Exam Gen.: Healthy and well-nourished in appearance. Alert, appropriate and cooperative throughout exam. Head: Normocephalic without obvious abnormalities;  no alopecia  Eyes: No corneal or  conjunctival inflammation noted. Pupils equal round reactive to light and accommodation. Fundal exam is benign without hemorrhages, exudate, papilledema. Extraocular motion intact. . Ears: External  ear exam reveals no significant lesions or deformities. Canals clear .TMs normal. Hearing is grossly normal bilaterally. Nose: External nasal exam reveals no deformity or inflammation. Nasal mucosa are pink and moist. No lesions or exudates noted.  Mouth: Oral mucosa and oropharynx reveal no lesions or exudates. Teeth in good repair. Neck: No deformities, masses, or tenderness noted. Range of motion &  Thyroid normal Lungs: Normal respiratory effort; chest expands symmetrically. Lungs are clear to auscultation without rales, wheezes, or increased work of breathing. Heart: Normal rate and rhythm. Normal S1 and S2. No gallop, click, or rub. S 4 with slurring ; no murmur. Abdomen: Bowel sounds normal; abdomen soft and nontender. No masses, organomegaly or hernias noted. Genitalia: normal; no DRE  .                                                                                   Musculoskeletal/extremities: Minimal lordosis noted of  the thoracic  spine. No clubbing, cyanosis,  or deformity noted. Range of motion  normal .Tone & strength  normal.Joints normal. Nail health  Good. 1/2 + edema LLE Vascular: Carotid, radial artery, dorsalis pedis and  posterior tibial pulses are full and equal. No bruits present. Neurologic: Alert and oriented x3. Deep tendon reflexes symmetrical and normal.         Skin: Intact without suspicious lesions or rashes. Lymph: No cervical, axillary, or inguinal lymphadenopathy present. Psych: Mood and affect are normal. Normally interactive                                                                                         Assessment & Plan:  #1 Medicare Wellness Exam; criteria met ; data entered #2 Problem List reviewed ; Assessment/ Recommendations made  #3 localized  edema left lower extremity. Support hose  will be prescribed. Plan: see Orders

## 2011-09-13 NOTE — Assessment & Plan Note (Signed)
Blood pressure goals discussed. 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 Labs will be checked.

## 2011-09-13 NOTE — Assessment & Plan Note (Signed)
His urologist has retired; PSA will be monitored.

## 2011-09-13 NOTE — Patient Instructions (Signed)
Preventive Health Care: Exercise at least 30-45 minutes a day,  3-4 days a week.  Eat a low-fat diet with lots of fruits and vegetables, up to 7-9 servings per day. Avoid obesity; your goal is waist measurement < 40 inches.Consume less than 40 grams of sugar per day from foods & drinks with High Fructose Corn Sugar as # 1,2,3 or # 4 on label.  Blood Pressure Goal  Ideally is an AVERAGE < 135/85. This AVERAGE should be calculated from @ least 5-7 BP readings taken @ different times of day on different days of week. You should not respond to isolated BP readings , but rather the AVERAGE for that week  Please take enteric-coated aspirin 81 mg daily with breakfast.

## 2011-09-13 NOTE — Assessment & Plan Note (Signed)
He is asymptomatic; CBC will be checked.

## 2011-09-14 ENCOUNTER — Encounter: Payer: Self-pay | Admitting: Internal Medicine

## 2012-04-11 ENCOUNTER — Encounter: Payer: Self-pay | Admitting: Gastroenterology

## 2012-09-05 ENCOUNTER — Encounter: Payer: Self-pay | Admitting: Internal Medicine

## 2012-09-06 ENCOUNTER — Other Ambulatory Visit: Payer: Self-pay | Admitting: Internal Medicine

## 2012-10-18 ENCOUNTER — Other Ambulatory Visit: Payer: Self-pay | Admitting: Internal Medicine

## 2012-10-25 ENCOUNTER — Encounter: Payer: Medicare Other | Admitting: Internal Medicine

## 2012-11-01 ENCOUNTER — Encounter: Payer: Self-pay | Admitting: Gastroenterology

## 2012-11-01 ENCOUNTER — Ambulatory Visit (INDEPENDENT_AMBULATORY_CARE_PROVIDER_SITE_OTHER): Payer: Medicare Other | Admitting: Internal Medicine

## 2012-11-01 ENCOUNTER — Encounter: Payer: Self-pay | Admitting: Internal Medicine

## 2012-11-01 VITALS — BP 118/82 | HR 74 | Resp 14 | Ht 67.08 in | Wt 227.0 lb

## 2012-11-01 DIAGNOSIS — Z8546 Personal history of malignant neoplasm of prostate: Secondary | ICD-10-CM

## 2012-11-01 DIAGNOSIS — E785 Hyperlipidemia, unspecified: Secondary | ICD-10-CM

## 2012-11-01 DIAGNOSIS — Z0000 Encounter for general adult medical examination without abnormal findings: Secondary | ICD-10-CM

## 2012-11-01 DIAGNOSIS — Z86010 Personal history of colonic polyps: Secondary | ICD-10-CM

## 2012-11-01 DIAGNOSIS — I10 Essential (primary) hypertension: Secondary | ICD-10-CM

## 2012-11-01 LAB — BASIC METABOLIC PANEL
BUN: 18 mg/dL (ref 6–23)
Calcium: 8.8 mg/dL (ref 8.4–10.5)
Creatinine, Ser: 0.9 mg/dL (ref 0.4–1.5)
Glucose, Bld: 91 mg/dL (ref 70–99)
Potassium: 4.1 mEq/L (ref 3.5–5.1)
Sodium: 140 mEq/L (ref 135–145)

## 2012-11-01 LAB — HEPATIC FUNCTION PANEL
Albumin: 4.2 g/dL (ref 3.5–5.2)
Alkaline Phosphatase: 66 U/L (ref 39–117)
Bilirubin, Direct: 0.2 mg/dL (ref 0.0–0.3)
Total Protein: 6.6 g/dL (ref 6.0–8.3)

## 2012-11-01 LAB — LIPID PANEL
HDL: 54.9 mg/dL (ref >39.00)
Total CHOL/HDL Ratio: 3
Triglycerides: 79 mg/dL (ref 0.0–149.0)
VLDL: 15.8 mg/dL (ref 0.0–40.0)

## 2012-11-01 LAB — CBC WITH DIFFERENTIAL/PLATELET
Basophils Absolute: 0 10*3/uL (ref 0.0–0.1)
Eosinophils Absolute: 0.1 10*3/uL (ref 0.0–0.7)
Lymphocytes Relative: 16.4 % (ref 12.0–46.0)
MCHC: 33.4 g/dL (ref 30.0–36.0)
MCV: 94.1 fl (ref 78.0–100.0)
Monocytes Absolute: 0.5 10*3/uL (ref 0.1–1.0)
Neutrophils Relative %: 74.8 % (ref 43.0–77.0)
RDW: 13.2 % (ref 11.5–14.6)

## 2012-11-01 LAB — TSH: TSH: 0.98 u[IU]/mL (ref 0.35–5.50)

## 2012-11-01 MED ORDER — PRAVASTATIN SODIUM 20 MG PO TABS: ORAL_TABLET | ORAL | Status: DC

## 2012-11-01 MED ORDER — METOPROLOL TARTRATE 100 MG PO TABS: ORAL_TABLET | ORAL | Status: DC

## 2012-11-01 MED ORDER — LOSARTAN POTASSIUM 100 MG PO TABS: ORAL_TABLET | ORAL | Status: DC

## 2012-11-01 NOTE — Progress Notes (Signed)
Subjective:    Patient ID: Russell [name], male    DOB: [birth year], 72 y.o.   MRN: [id number]  HPI  Medicare Wellness Visit:  Psychosocial & medical history were reviewed as required by Medicare (abuse,antisocial behavioral risks,firearm risk).  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 Power of Attorney/Living Will status : in place Ophthalmology exam current Hearing evaluation not current Orientation :oriented X 3  Memory & recall :good Math testing:good Mood & affect : normal . Depression / anxiety: denied Travel history : last 2012 [place]  Immunization status :? Shingles  Needed. Never has taken Flu shot Transfusion history:  none  Preventive health surveillance ( colonoscopy as per protocol/ [facility]):  colonoscopy ? due Dental care:  Every 6 mos. Chart reviewed &  Updated. Active issues reviewed & addressed.      Review of Systems HYPERTENSION: Disease Monitoring: Blood pressure range not monitored Compliant with present antihypertensive regimen   HYPERLIPIDEMIA: Diet:heart healthy; exercise as noted Medication Compliance:  Statin taken  No FH premature CAD / CVA   Past medical history/family history/social history were all reviewed and updated.    Signs & symptoms not present or negative as noted below: No significant headaches No chest pain, palpitations , claudications, PNDyspnea    No exertional dyspnea No lightheadedness or syncope   No edema No polyuria/phagia/dipsia No limb numbness & tingling or weakness No blurred , double or loss of vision No abd pain, bowel changes No significant myalgias             Objective:   Physical Exam Gen.: Healthy and well-nourished in appearance. Alert, appropriate and cooperative throughout  exam. Appears younger than stated age  Head: Normocephalic without obvious abnormalities; no alopecia  Eyes: No corneal or conjunctival inflammation noted. Pupils equal round reactive to light and accommodation.  Extraocular motion intact. Vision grossly normal without lenses Ears: External  ear exam reveals no significant lesions or deformities. Canals clear .TMs normal. Hearing is grossly normal bilaterally. Nose: External nasal exam reveals no deformity or inflammation. Nasal mucosa are pink and moist. No lesions or exudates noted.   Mouth: Oral mucosa and oropharynx reveal no lesions or exudates. Teeth in good repair. Neck: No deformities, masses, or tenderness noted. Range of motion & Thyroid normal. Lungs: Normal respiratory effort; chest expands symmetrically. Lungs are clear to auscultation without rales, wheezes, or increased work of breathing. Heart: Normal rate and rhythm. Normal S1 and S2. No gallop, click, or rub. S4 w/o murmur.Occasional extra beat. Abdomen: Bowel sounds normal; abdomen soft and nontender. No masses, organomegaly or hernias noted. Genitalia: exam deferred. S/P Prostatectomy.  Musculoskeletal/extremities:There is some asymmetry of the posterior thoracic musculature suggesting occult scoliosis. No clubbing, cyanosis, edema, or significant extremity  deformity noted. Range of motion normal .Tone & strength  Normal. Minor crepitus , L knee > R Joints  reveal minor DJD DIP changes. Nail health good. Able to lie down & sit up w/o help. Negative SLR bilaterally Vascular: Carotid, radial artery, dorsalis pedis and  posterior tibial pulses are full and equal. No bruits present. Neurologic: Alert and oriented x3. Deep tendon reflexes symmetrical and normal.   Skin: Intact without suspicious lesions or rashes. Lymph: No cervical or axillary lymphadenopathy present. Psych: Mood  and affect are normal. Normally interactive                                                                                       Assessment & Plan:  #1 Medicare Wellness Exam; criteria met ; data entered #2 Problem List reviewed ; Assessment/ Recommendations made Plan: see Orders

## 2012-11-01 NOTE — Patient Instructions (Addendum)
Minimal Blood Pressure Goal= AVERAGE < 140/90;  Ideal is an AVERAGE < 135/85. This AVERAGE should be calculated from @ least 5-7 BP readings taken @ different times of day on different days of week. You should not respond to isolated BP readings , but rather the AVERAGE for that week .Please bring your  blood pressure cuff to office visits to verify that it is reliable.It  can also be checked against the blood pressure device at the pharmacy. Finger or wrist cuffs are not dependable; an arm cuff is. To prevent palpitations or premature beats, avoid stimulants such as decongestants, diet pills, nicotine, or caffeine (coffee, tea, cola, or chocolate) to excess. Review and correct the record as indicated. Please share record with all medical staff seen.

## 2012-12-05 ENCOUNTER — Encounter: Payer: Self-pay | Admitting: Gastroenterology

## 2012-12-05 ENCOUNTER — Ambulatory Visit (AMBULATORY_SURGERY_CENTER): Payer: Medicare Other | Admitting: *Deleted

## 2012-12-05 VITALS — Ht 68.0 in | Wt 231.4 lb

## 2012-12-05 DIAGNOSIS — Z1211 Encounter for screening for malignant neoplasm of colon: Secondary | ICD-10-CM

## 2012-12-05 DIAGNOSIS — Z800 Family history of malignant neoplasm of digestive organs: Secondary | ICD-10-CM

## 2012-12-05 MED ORDER — MOVIPREP 100 G PO SOLR: 1.0000 | Freq: Once | ORAL | Status: DC

## 2012-12-05 NOTE — Progress Notes (Signed)
Denies allergies to eggs or soy products. Denies any complications with sedation or anesthesia. 

## 2012-12-13 DIAGNOSIS — K5790 Diverticulosis of intestine, part unspecified, without perforation or abscess without bleeding: Secondary | ICD-10-CM

## 2012-12-13 HISTORY — DX: Diverticulosis of intestine, part unspecified, without perforation or abscess without bleeding: K57.90

## 2012-12-19 ENCOUNTER — Encounter: Payer: Self-pay | Admitting: Gastroenterology

## 2012-12-19 ENCOUNTER — Ambulatory Visit (AMBULATORY_SURGERY_CENTER): Payer: Medicare Other | Admitting: Gastroenterology

## 2012-12-19 VITALS — BP 134/85 | HR 50 | Temp 96.9°F | Resp 20 | Ht 68.0 in | Wt 231.0 lb

## 2012-12-19 DIAGNOSIS — Z800 Family history of malignant neoplasm of digestive organs: Secondary | ICD-10-CM

## 2012-12-19 DIAGNOSIS — Z1211 Encounter for screening for malignant neoplasm of colon: Secondary | ICD-10-CM

## 2012-12-19 DIAGNOSIS — K5730 Diverticulosis of large intestine without perforation or abscess without bleeding: Secondary | ICD-10-CM

## 2012-12-19 MED ORDER — SODIUM CHLORIDE 0.9 % IV SOLN: 500.0000 mL | INTRAVENOUS | Status: DC

## 2012-12-19 NOTE — Patient Instructions (Addendum)
Discharge instructions given with verbal understanding. Handouts on diverticulosis and a high fiber diet given. Resume previous medications.YOU HAD AN ENDOSCOPIC PROCEDURE TODAY AT THE [facility]: Refer to the procedure report that was given to you for any specific questions about what was found during the examination.  If the procedure report does not answer your questions, please call your gastroenterologist to clarify.  If you requested that your care partner not be given the details of your procedure findings, then the procedure report has been included in a sealed envelope for you to review at your convenience later.  YOU SHOULD EXPECT: Some feelings of bloating in the abdomen. Passage of more gas than usual.  Walking can help get rid of the air that was put into your GI tract during the procedure and reduce the bloating. If you had a lower endoscopy (such as a colonoscopy or flexible sigmoidoscopy) you may notice spotting of blood in your stool or on the toilet paper. If you underwent a bowel prep for your procedure, then you may not have a normal bowel movement for a few days.  DIET: Your first meal following the procedure should be a light meal and then it is ok to progress to your normal diet.  A half-sandwich or bowl of soup is an example of a good first meal.  Heavy or fried foods are harder to digest and may make you feel nauseous or bloated.  Likewise meals heavy in dairy and vegetables can cause extra gas to form and this can also increase the bloating.  Drink plenty of fluids but you should avoid alcoholic beverages for 24 hours.  ACTIVITY: Your care partner should take you home directly after the procedure.  You should plan to take it easy, moving slowly for the rest of the day.  You can resume normal activity the day after the procedure however you should NOT DRIVE or use heavy machinery for 24 hours (because of the sedation medicines used during the test).    SYMPTOMS TO  REPORT IMMEDIATELY: A gastroenterologist can be reached at any hour.  During normal business hours, 8:30 AM to 5:00 PM Monday through Friday, call [phone].  After hours and on weekends, please call the GI answering service at [phone] who will take a message and have the physician on call contact you.   Following lower endoscopy (colonoscopy or flexible sigmoidoscopy):  Excessive amounts of blood in the stool  Significant tenderness or worsening of abdominal pains  Swelling of the abdomen that is new, acute  Fever of 100F or higher  FOLLOW UP: If any biopsies were taken you will be contacted by phone or by letter within the next 1-3 weeks.  Call your gastroenterologist if you have not heard about the biopsies in 3 weeks.  Our staff will call the home number listed on your records the next business day following your procedure to check on you and address any questions or concerns that you may have at that time regarding the information given to you following your procedure. This is a courtesy call and so if there is no answer at the home number and we have not heard from you through the emergency physician on call, we will assume that you have returned to your regular daily activities without incident.  SIGNATURES/CONFIDENTIALITY: You and/or your care partner have signed paperwork which will be entered into your electronic medical record.  These signatures attest to the fact that that the information above on your   After Visit Summary has been reviewed and is understood.  Full responsibility of the confidentiality of this discharge information lies with you and/or your care-partner.   

## 2012-12-19 NOTE — Progress Notes (Signed)
Patient did not experience any of the following events: a burn prior to discharge; a fall within the facility; wrong site/side/patient/procedure/implant event; or a hospital transfer or hospital admission upon discharge from the facility. (G8907) Patient did not have preoperative order for IV antibiotic SSI prophylaxis. (G8918)  

## 2012-12-19 NOTE — Op Note (Signed)
[facility] [address] [place], [address]   COLONOSCOPY PROCEDURE REPORT  PATIENT: Russell [name], Russell [name]  MR#: [id number] BIRTHDATE: 03-Mar-1941 , 71  yrs. old GENDER: Male ENDOSCOPIST: Mardella Layman, MD, [facility] REFERRED BY: PROCEDURE DATE:  12/19/2012 PROCEDURE:   Colonoscopy, screening ASA CLASS:   Class II INDICATIONS:Patient's immediate family history of colon cancer. MEDICATIONS: propofol (Diprivan) 150mg  IV  DESCRIPTION OF PROCEDURE:   After the risks and benefits and of the procedure were explained, informed consent was obtained.  A digital rectal exam revealed no abnormalities of the rectum.    The LB [id number]  endoscope was introduced through the anus and advanced to the cecum, which was identified by both the appendix and ileocecal valve .  The quality of the prep was excellent, using MoviPrep .  The instrument was then slowly withdrawn as the colon was fully examined.     COLON FINDINGS: Mild diverticulosis was noted in the sigmoid colon and at the hepatic flexure.   The colon was otherwise normal. There was no diverticulosis, inflammation, polyps or cancers unless previously stated.     Retroflexed views revealed no abnormalities. The scope was then withdrawn from the patient and the procedure completed.  COMPLICATIONS: There were no complications. ENDOSCOPIC IMPRESSION: 1.   Mild diverticulosis was noted in the sigmoid colon and at the hepatic flexure 2.   The colon was otherwise normal ..no polyps noted...  RECOMMENDATIONS: 1.  Continue current medications 2.  High fiber diet 3.  Given your significant family history of colon cancer, you should have a repeat colonoscopy in 5 years   REPEAT EXAM:  cc:  _______________________________ eSigned:  Mardella Layman, MD, FACG 12/19/2012 10:00 AM     PATIENT NAME:  Russell [name] MR#: [id number]

## 2012-12-20 ENCOUNTER — Telehealth: Payer: Self-pay | Admitting: *Deleted

## 2012-12-20 NOTE — Telephone Encounter (Signed)
  Follow up Call-  Call back number 12/19/2012  Post procedure Call Back phone  # [phone]  Permission to leave phone message Yes     Patient questions:  Do you have a fever, pain , or abdominal swelling? no Pain Score  0 *  Have you tolerated food without any problems? yes  Have you been able to return to your normal activities? yes  Do you have any questions about your discharge instructions: Diet   no Medications  no Follow up visit  no  Do you have questions or concerns about your Care? no  Actions: * If pain score is 4 or above: No action needed, pain <4.

## 2013-03-18 ENCOUNTER — Other Ambulatory Visit: Payer: Self-pay | Admitting: Internal Medicine

## 2013-09-18 ENCOUNTER — Other Ambulatory Visit: Payer: Self-pay | Admitting: Internal Medicine

## 2013-09-19 NOTE — Telephone Encounter (Signed)
Med filled until CPE next month

## 2013-11-04 ENCOUNTER — Encounter: Payer: Self-pay | Admitting: Internal Medicine

## 2013-11-04 ENCOUNTER — Ambulatory Visit (INDEPENDENT_AMBULATORY_CARE_PROVIDER_SITE_OTHER): Payer: Medicare Other | Admitting: Internal Medicine

## 2013-11-04 VITALS — BP 160/78 | HR 56 | Temp 97.3°F | Resp 14 | Ht 68.25 in | Wt 239.6 lb

## 2013-11-04 DIAGNOSIS — E785 Hyperlipidemia, unspecified: Secondary | ICD-10-CM

## 2013-11-04 DIAGNOSIS — Z8546 Personal history of malignant neoplasm of prostate: Secondary | ICD-10-CM

## 2013-11-04 DIAGNOSIS — Z0000 Encounter for general adult medical examination without abnormal findings: Secondary | ICD-10-CM

## 2013-11-04 DIAGNOSIS — Z86010 Personal history of colonic polyps: Secondary | ICD-10-CM

## 2013-11-04 DIAGNOSIS — E669 Obesity, unspecified: Secondary | ICD-10-CM | POA: Insufficient documentation

## 2013-11-04 DIAGNOSIS — I10 Essential (primary) hypertension: Secondary | ICD-10-CM

## 2013-11-04 MED ORDER — LOSARTAN POTASSIUM 100 MG PO TABS: ORAL_TABLET | ORAL | Status: DC

## 2013-11-04 MED ORDER — METOPROLOL TARTRATE 100 MG PO TABS: ORAL_TABLET | ORAL | Status: DC

## 2013-11-04 MED ORDER — PRAVASTATIN SODIUM 20 MG PO TABS: ORAL_TABLET | ORAL | Status: DC

## 2013-11-04 NOTE — Progress Notes (Signed)
Pre visit review using our clinic review tool, if applicable. No additional management support is needed unless otherwise documented below in the visit note. 

## 2013-11-04 NOTE — Patient Instructions (Signed)
Your next office appointment will be determined based upon review of your pending labs . Those instructions will be transmitted to you through My Chart  OR  by mail;whichever process is your choice to receive results & recommendations . Using this system allows you access to your chart essentially anywhere in the world where there is Internet access. Minimal Blood Pressure Goal= AVERAGE < 140/90;  Ideal is an AVERAGE < 135/85. This AVERAGE should be calculated from @ least 5-7 BP readings taken @ different times of day on different days of week. You should not respond to isolated BP readings , but rather the AVERAGE for that week .Please bring your  blood pressure cuff to office visits to verify that it is reliable.It  can also be checked against the blood pressure device at the pharmacy. Finger or wrist cuffs are not dependable; an arm cuff is.

## 2013-11-04 NOTE — Addendum Note (Signed)
Addended by: Harl Bowie on: 11/04/2013 10:16 AM   Modules accepted: Orders

## 2013-11-04 NOTE — Progress Notes (Signed)
Subjective:    Patient ID: Russell [name], male    DOB: 10/30/1940, 73 y.o.   MRN: [id number]  HPI Medicare Wellness Visit: Psychosocial and medical history were reviewed as required by Medicare (history related to abuse, antisocial behavior , firearm risk). Social history: Caffeine: 2 cups/day , Alcohol:  10/week, Tobacco XFG:HWEX 1980 Exercise:see below Personal safety/fall risk:no Limitations of activities of daily living: no Seatbelt/ smoke alarm use:yes Healthcare Power of Attorney/Living Will status: UTD Ophthalmologic exam status:UTD Hearing evaluation status: not UTD Orientation: Oriented X 3 Memory and recall: good Spelling or math testing: good Depression/anxiety assessment: no Foreign travel history: 2011 [place] Immunization status for influenza/pneumonia/ shingles /tetanus: Flu not taken; no PMH chicken pox Transfusion history:no Preventive health care maintenance status: Colonoscopy as per protocol/standard care: UTD Dental care:every 6 mos Chart reviewed and updated. Active issues reviewed and addressed as documented below.    Review of Systems A heart healthy diet is followed; exercise encompasses 60 minutes 6 times per week as  Walking or gym without symptoms.  Family history is negative for premature coronary disease. Advanced cholesterol testing reveals  LDL goal is less than 100 ; ideally < 70 . There is medication compliance with the statin.  Low dose ASA taken Specifically denied are  chest pain, palpitations, dyspnea, or claudication.  Significant abdominal symptoms, memory deficit, or myalgias not present. BP @ home not monitored (See BP; goals discussed)     Objective:   Physical Exam Gen.:  well-nourished in appearance. Alert, appropriate and cooperative throughout exam. Central weight excess (see BMI) Appears younger than stated age  Head: Normocephalic without obvious abnormalities;  no alopecia  Eyes: No corneal or conjunctival inflammation  noted. Pupils equal round reactive to light and accommodation. Extraocular motion intact.  Ears: External  ear exam reveals no significant lesions or deformities. Canals clear .TMs normal. Hearing is grossly normal bilaterally. Nose: External nasal exam reveals no deformity or inflammation. Nasal mucosa are pink and moist. No lesions or exudates noted.   Mouth: Oral mucosa and oropharynx reveal no lesions or exudates. Teeth in good repair. Neck: No deformities, masses, or tenderness noted. Range of motion & Thyroid normal. Lungs: Normal respiratory effort; chest expands symmetrically. Lungs are clear to auscultation without rales, wheezes, or increased work of breathing. Heart: Slow rate and regular  rhythm. Normal S1 and S2. No gallop, click, or rub. No murmur. Abdomen: Bowel sounds normal; abdomen soft and nontender. No masses, organomegaly or hernias noted. Genitalia:  MALE:deferred                                 Musculoskeletal/extremities: No deformity or scoliosis noted of  the thoracic or lumbar spine.   No clubbing, cyanosis, edema, or significant extremity  deformity noted. Range of motion normal .Tone & strength normal. Hand joints normal  Fingernail health good. Able to lie down & sit up w/o help. Negative SLR bilaterally Vascular: Carotid, radial artery, dorsalis pedis and  posterior tibial pulses are full and equal. No bruits present. Neurologic: Alert and oriented x3. Deep tendon reflexes symmetrical and normal.  Gait normal  .       Skin: Intact without suspicious lesions or rashes. Lymph: No cervical, axillary lymphadenopathy present. Psych: Mood and affect are normal. Normally interactive  Assessment & Plan:  #1 Medicare Wellness Exam; criteria met ; data entered #2 Problem List/Diagnoses reviewed Plan:  Assessments made/ Orders entered  

## 2013-11-05 ENCOUNTER — Telehealth: Payer: Self-pay | Admitting: Internal Medicine

## 2013-11-05 NOTE — Telephone Encounter (Signed)
Relevant patient education assigned to patient using Emmi. ° °

## 2013-11-14 ENCOUNTER — Other Ambulatory Visit (INDEPENDENT_AMBULATORY_CARE_PROVIDER_SITE_OTHER): Payer: Medicare Other

## 2013-11-14 DIAGNOSIS — I10 Essential (primary) hypertension: Secondary | ICD-10-CM

## 2013-11-14 DIAGNOSIS — Z8546 Personal history of malignant neoplasm of prostate: Secondary | ICD-10-CM

## 2013-11-14 DIAGNOSIS — E785 Hyperlipidemia, unspecified: Secondary | ICD-10-CM

## 2013-11-14 DIAGNOSIS — Z86010 Personal history of colonic polyps: Secondary | ICD-10-CM

## 2013-11-14 LAB — BASIC METABOLIC PANEL
BUN: 23 mg/dL (ref 6–23)
CALCIUM: 8.9 mg/dL (ref 8.4–10.5)
CO2: 28 mEq/L (ref 19–32)
Chloride: 103 mEq/L (ref 96–112)
Creatinine, Ser: 1.1 mg/dL (ref 0.4–1.5)
GFR: 73.03 mL/min (ref >60.00)
Glucose, Bld: 92 mg/dL (ref 70–99)
Potassium: 5.4 mEq/L — ABNORMAL HIGH (ref 3.5–5.1)
SODIUM: 139 meq/L (ref 135–145)

## 2013-11-14 LAB — LIPID PANEL
CHOL/HDL RATIO: 3
Cholesterol: 203 mg/dL — ABNORMAL HIGH (ref 0–200)
HDL: 61.1 mg/dL (ref >39.00)
LDL Cholesterol: 127 mg/dL — ABNORMAL HIGH (ref 0–99)
Triglycerides: 77 mg/dL (ref 0.0–149.0)
VLDL: 15.4 mg/dL (ref 0.0–40.0)

## 2013-11-14 LAB — CBC WITH DIFFERENTIAL/PLATELET
BASOS ABS: 0 10*3/uL (ref 0.0–0.1)
BASOS PCT: 0.6 % (ref 0.0–3.0)
Eosinophils Absolute: 0.1 10*3/uL (ref 0.0–0.7)
Eosinophils Relative: 2.9 % (ref 0.0–5.0)
HCT: 44.3 % (ref 39.0–52.0)
HEMOGLOBIN: 15 g/dL (ref 13.0–17.0)
LYMPHS PCT: 39.7 % (ref 12.0–46.0)
Lymphs Abs: 1.9 10*3/uL (ref 0.7–4.0)
MCHC: 33.9 g/dL (ref 30.0–36.0)
MCV: 93.4 fl (ref 78.0–100.0)
MONOS PCT: 9.5 % (ref 3.0–12.0)
Monocytes Absolute: 0.5 10*3/uL (ref 0.1–1.0)
Neutro Abs: 2.3 10*3/uL (ref 1.4–7.7)
Neutrophils Relative %: 47.3 % (ref 43.0–77.0)
Platelets: 235 10*3/uL (ref 150.0–400.0)
RBC: 4.74 Mil/uL (ref 4.22–5.81)
RDW: 13.2 % (ref 11.5–14.6)
WBC: 4.8 10*3/uL (ref 4.5–10.5)

## 2013-11-14 LAB — HEPATIC FUNCTION PANEL
ALK PHOS: 60 U/L (ref 39–117)
ALT: 20 U/L (ref 0–53)
AST: 26 U/L (ref 0–37)
Albumin: 4.1 g/dL (ref 3.5–5.2)
Bilirubin, Direct: 0.1 mg/dL (ref 0.0–0.3)
Total Bilirubin: 0.7 mg/dL (ref 0.3–1.2)
Total Protein: 6.7 g/dL (ref 6.0–8.3)

## 2013-11-14 LAB — PSA: PSA: 0 ng/mL — ABNORMAL LOW (ref 0.10–4.00)

## 2013-11-14 LAB — TSH: TSH: 1.49 u[IU]/mL (ref 0.35–5.50)

## 2013-11-16 ENCOUNTER — Other Ambulatory Visit: Payer: Self-pay | Admitting: Internal Medicine

## 2013-11-16 DIAGNOSIS — E785 Hyperlipidemia, unspecified: Secondary | ICD-10-CM

## 2013-11-16 MED ORDER — ATORVASTATIN CALCIUM 20 MG PO TABS: ORAL_TABLET | ORAL | Status: DC

## 2013-11-18 ENCOUNTER — Telehealth: Payer: Self-pay | Admitting: *Deleted

## 2013-11-18 ENCOUNTER — Encounter: Payer: Self-pay | Admitting: *Deleted

## 2013-11-18 DIAGNOSIS — T887XXA Unspecified adverse effect of drug or medicament, initial encounter: Secondary | ICD-10-CM

## 2013-11-18 DIAGNOSIS — E785 Hyperlipidemia, unspecified: Secondary | ICD-10-CM

## 2013-11-18 MED ORDER — ATORVASTATIN CALCIUM 20 MG PO TABS: ORAL_TABLET | ORAL | Status: DC

## 2013-11-18 NOTE — Telephone Encounter (Signed)
Spoke with the pt and informed him if recent lab results and note.  Pt understood and agreed.  Informed the pt that his results with the note will be mailed to him.//AB/CMA

## 2013-11-18 NOTE — Telephone Encounter (Signed)
LMOM (11:03am) asking the pt to RTC regarding recent lab results and note regarding changing cholest meds.//AB/CMA

## 2013-11-18 NOTE — Telephone Encounter (Signed)
Message copied by Harl Bowie on Mon Nov 18, 2013 10:57 AM ------      Message from: Russell [name]      Created: Sat Nov 16, 2013  4:22 PM       Please send new  Rx for Atorvastatin in place of Pravastatin with labs. He should refill Pravastatin ------

## 2014-02-27 ENCOUNTER — Other Ambulatory Visit: Payer: Self-pay | Admitting: Internal Medicine

## 2014-04-25 ENCOUNTER — Encounter: Payer: Self-pay | Admitting: Gastroenterology

## 2014-10-24 DIAGNOSIS — H2512 Age-related nuclear cataract, left eye: Secondary | ICD-10-CM | POA: Diagnosis not present

## 2014-10-24 DIAGNOSIS — Z961 Presence of intraocular lens: Secondary | ICD-10-CM | POA: Diagnosis not present

## 2014-10-24 DIAGNOSIS — H25012 Cortical age-related cataract, left eye: Secondary | ICD-10-CM | POA: Diagnosis not present

## 2014-10-24 DIAGNOSIS — D3132 Benign neoplasm of left choroid: Secondary | ICD-10-CM | POA: Diagnosis not present

## 2014-10-28 DIAGNOSIS — D485 Neoplasm of uncertain behavior of skin: Secondary | ICD-10-CM | POA: Diagnosis not present

## 2014-10-28 DIAGNOSIS — L821 Other seborrheic keratosis: Secondary | ICD-10-CM | POA: Diagnosis not present

## 2014-10-28 DIAGNOSIS — Z85828 Personal history of other malignant neoplasm of skin: Secondary | ICD-10-CM | POA: Diagnosis not present

## 2014-10-28 DIAGNOSIS — L570 Actinic keratosis: Secondary | ICD-10-CM | POA: Diagnosis not present

## 2014-10-28 DIAGNOSIS — D225 Melanocytic nevi of trunk: Secondary | ICD-10-CM | POA: Diagnosis not present

## 2014-10-28 DIAGNOSIS — L817 Pigmented purpuric dermatosis: Secondary | ICD-10-CM | POA: Diagnosis not present

## 2014-11-06 ENCOUNTER — Ambulatory Visit (INDEPENDENT_AMBULATORY_CARE_PROVIDER_SITE_OTHER): Payer: Medicare Other | Admitting: Internal Medicine

## 2014-11-06 ENCOUNTER — Encounter: Payer: Self-pay | Admitting: Internal Medicine

## 2014-11-06 ENCOUNTER — Other Ambulatory Visit (INDEPENDENT_AMBULATORY_CARE_PROVIDER_SITE_OTHER): Payer: Medicare Other

## 2014-11-06 VITALS — BP 140/100 | HR 54 | Temp 98.7°F | Resp 15 | Ht 68.0 in | Wt 230.0 lb

## 2014-11-06 DIAGNOSIS — Z86010 Personal history of colonic polyps: Secondary | ICD-10-CM

## 2014-11-06 DIAGNOSIS — I10 Essential (primary) hypertension: Secondary | ICD-10-CM | POA: Diagnosis not present

## 2014-11-06 DIAGNOSIS — J209 Acute bronchitis, unspecified: Secondary | ICD-10-CM

## 2014-11-06 DIAGNOSIS — Z0000 Encounter for general adult medical examination without abnormal findings: Secondary | ICD-10-CM

## 2014-11-06 DIAGNOSIS — E785 Hyperlipidemia, unspecified: Secondary | ICD-10-CM

## 2014-11-06 DIAGNOSIS — Z8546 Personal history of malignant neoplasm of prostate: Secondary | ICD-10-CM | POA: Diagnosis not present

## 2014-11-06 LAB — CBC WITH DIFFERENTIAL/PLATELET
Basophils Absolute: 0 10*3/uL (ref 0.0–0.1)
Basophils Relative: 0.4 % (ref 0.0–3.0)
EOS ABS: 0 10*3/uL (ref 0.0–0.7)
Eosinophils Relative: 0.8 % (ref 0.0–5.0)
HEMATOCRIT: 42.9 % (ref 39.0–52.0)
HEMOGLOBIN: 14.8 g/dL (ref 13.0–17.0)
LYMPHS ABS: 1.7 10*3/uL (ref 0.7–4.0)
Lymphocytes Relative: 30 % (ref 12.0–46.0)
MCHC: 34.5 g/dL (ref 30.0–36.0)
MCV: 89.7 fl (ref 78.0–100.0)
MONO ABS: 0.5 10*3/uL (ref 0.1–1.0)
Monocytes Relative: 9.1 % (ref 3.0–12.0)
NEUTROS ABS: 3.3 10*3/uL (ref 1.4–7.7)
Neutrophils Relative %: 59.7 % (ref 43.0–77.0)
Platelets: 312 10*3/uL (ref 150.0–400.0)
RBC: 4.79 Mil/uL (ref 4.22–5.81)
RDW: 13.1 % (ref 11.5–15.5)
WBC: 5.6 10*3/uL (ref 4.0–10.5)

## 2014-11-06 LAB — LIPID PANEL
Cholesterol: 165 mg/dL (ref 0–200)
HDL: 52.4 mg/dL (ref >39.00)
LDL Cholesterol: 94 mg/dL (ref 0–99)
NONHDL: 112.6
TRIGLYCERIDES: 91 mg/dL (ref 0.0–149.0)
Total CHOL/HDL Ratio: 3
VLDL: 18.2 mg/dL (ref 0.0–40.0)

## 2014-11-06 LAB — HEPATIC FUNCTION PANEL
ALT: 20 U/L (ref 0–53)
AST: 23 U/L (ref 0–37)
Albumin: 4 g/dL (ref 3.5–5.2)
Alkaline Phosphatase: 102 U/L (ref 39–117)
BILIRUBIN TOTAL: 0.7 mg/dL (ref 0.2–1.2)
Bilirubin, Direct: 0.1 mg/dL (ref 0.0–0.3)
Total Protein: 6.8 g/dL (ref 6.0–8.3)

## 2014-11-06 LAB — BASIC METABOLIC PANEL
BUN: 21 mg/dL (ref 6–23)
CO2: 29 mEq/L (ref 19–32)
Calcium: 9.1 mg/dL (ref 8.4–10.5)
Chloride: 106 mEq/L (ref 96–112)
Creatinine, Ser: 0.99 mg/dL (ref 0.40–1.50)
GFR: 78.81 mL/min (ref >60.00)
GLUCOSE: 95 mg/dL (ref 70–99)
POTASSIUM: 5.4 meq/L — AB (ref 3.5–5.1)
SODIUM: 140 meq/L (ref 135–145)

## 2014-11-06 LAB — TSH: TSH: 1.07 u[IU]/mL (ref 0.35–4.50)

## 2014-11-06 LAB — PSA: PSA: 0.01 ng/mL — ABNORMAL LOW (ref 0.10–4.00)

## 2014-11-06 MED ORDER — AZITHROMYCIN 250 MG PO TABS: ORAL_TABLET | ORAL | Status: DC

## 2014-11-06 MED ORDER — HYDROCODONE-HOMATROPINE 5-1.5 MG/5ML PO SYRP: 5.0000 mL | ORAL_SOLUTION | Freq: Four times a day (QID) | ORAL | Status: DC | PRN

## 2014-11-06 NOTE — Progress Notes (Signed)
Subjective:    Patient ID: Russell [name], male    DOB: [birth year], 74 y.o.   MRN: [id number]  HPI Medicare Wellness Visit: Psychosocial and medical history were reviewed as required by Medicare (history related to abuse, antisocial behavior , firearm risk). Social history: Caffeine: 2 cups of coffee/ day Alcohol: 1 drink eacheve Tobacco GYK:ZLDJ 1980 Exercise:see below Personal safety/fall risk: no Limitations of activities of daily living:no Seatbelt/ smoke alarm use:yes Healthcare Power of Attorney/Living Will status and End of Life process assessment : UTD Ophthalmologic exam status: UTD Hearing evaluation status:not UTD Orientation: Oriented X 3 Memory and recall: good Math testing: good Depression/anxiety assessment: no Foreign travel history:09/2014 [place] Immunization status for influenza/pneumonia/ shingles /tetanus:both PNA & Shingles needed Transfusion history:no Preventive health care maintenance status: Colonoscopy as per protocol/standard care:UTD Dental care:every 6 mos Chart reviewed and updated. Active issues reviewed and addressed as documented below.   He has developed a respiratory tract infection which has settled in his chest in the last 2 weeks.He describes a rattly , NP cough w/o dyspnea associated with slight wheezing. Mucinex (? D) & ASA of some benefit.  He is on a heart healthy diet and restrict salt. He exercises 5 times a week as walking 3. 5-4 miles per day or the fitness center. He has no associated cardiopulmonary symptoms. He does have occasional edema.  He has been compliant with his blood pressure and statin without adverse effects.   Review of Systems  Significant headaches, epistaxis, chest pain, palpitations, exertional dyspnea, claudication, or paroxysmal nocturnal dyspnea absent. No GI symptoms , memory loss or myalgias  Frontal headache, facial pain , nasal purulence, dental pain, sore throat , otic pain or otic discharge denied. No  fever , chills or sweats.     Objective:   Physical Exam  Gen.: Adequately nourished in appearance. Alert, appropriate and cooperative throughout exam. BMI: 34.98. Appears younger than stated age  Head: Normocephalic without obvious abnormalities; full thick head of hair.  Eyes: No corneal or conjunctival inflammation noted. Pupils equal round reactive to light and accommodation. Extraocular motion intact.  Ears: External  ear exam reveals no significant lesions or deformities. Canals clear .TMs normal. Hearing is grossly decreased on left. Nose: External nasal exam reveals no deformity or inflammation. Nasal mucosa are pink and moist. No lesions or exudates noted.   Mouth: Oral mucosa and oropharynx reveal no lesions or exudates. Teeth in good repair. Neck: No deformities, masses, or tenderness noted. Range of motion & Thyroid normal. Lungs: Normal respiratory effort; chest expands symmetrically. Lungs are clear to auscultation without rales, wheezes, or increased work of breathing. Heart: Normal rate and rhythm. Normal S1 and S2. No gallop, click, or rub. No murmur. Abdomen: Bowel sounds normal; abdomen soft and nontender. No masses, organomegaly or hernias noted. Genitalia: S/P total prostatectomy      Musculoskeletal/extremities: No deformity or scoliosis noted of  the thoracic or lumbar spine.  No clubbing, cyanosis, edema, or significant extremity  deformity noted.  Range of motion normal . Tone & strength normal. Hand joints normal Fingernail  health good. Crepitus of knees  Able to lie down & sit up w/o help.  Negative SLR bilaterally Vascular: Carotid, radial artery, dorsalis pedis and  posterior tibial pulses are full and equal. No bruits present. Neurologic: Alert and oriented x3. Deep tendon reflexes symmetrical and normal.  Gait normal   Heel & toe walking .  Rhomberg & finger to nose negative      Skin:  Intact without suspicious lesions or rashes. Lymph: No cervical,  axillary lymphadenopathy present. Psych: Mood and affect are normal. Normally interactive                                                                                      Assessment & Plan:  #1See Current Assessment & Plan in Problem List under specific Diagnosis #2 acute bronchitis;see orders & AVS

## 2014-11-06 NOTE — Assessment & Plan Note (Signed)
Lipids, LFTs, TSH  

## 2014-11-06 NOTE — Patient Instructions (Addendum)
  Your next office appointment will be determined based upon review of your pending labs   Those instructions will be transmitted to you by mail for your records.  Critical results will be called.   Followup as needed for any active or acute issue. Please report any significant change in your symptoms   Minimal Blood Pressure Goal= AVERAGE < 140/90;  Ideal is an AVERAGE < 135/85. This AVERAGE should be calculated from @ least 5-7 BP readings taken @ different times of day on different days of week. You should not respond to isolated BP readings , but rather the AVERAGE for that week .Please bring your  blood pressure cuff to office visits to verify that it is reliable.It  can also be checked against the blood pressure device at the pharmacy. Finger or wrist cuffs are not dependable; an arm cuff is.Avoid decongestants as these may elevate BP.

## 2014-11-06 NOTE — Assessment & Plan Note (Signed)
CBC

## 2014-11-06 NOTE — Assessment & Plan Note (Signed)
PSA

## 2014-11-06 NOTE — Assessment & Plan Note (Signed)
Blood pressure goals reviewed. BMET 

## 2014-11-06 NOTE — Progress Notes (Signed)
Pre visit review using our clinic review tool, if applicable. No additional management support is needed unless otherwise documented below in the visit note. 

## 2014-11-21 ENCOUNTER — Other Ambulatory Visit: Payer: Self-pay

## 2014-11-21 ENCOUNTER — Telehealth: Payer: Self-pay | Admitting: Internal Medicine

## 2014-11-21 DIAGNOSIS — O10019 Pre-existing essential hypertension complicating pregnancy, unspecified trimester: Secondary | ICD-10-CM

## 2014-11-21 MED ORDER — METOPROLOL TARTRATE 100 MG PO TABS: ORAL_TABLET | ORAL | Status: DC

## 2014-11-21 MED ORDER — ATORVASTATIN CALCIUM 20 MG PO TABS: ORAL_TABLET | ORAL | Status: DC

## 2014-11-21 MED ORDER — LOSARTAN POTASSIUM 100 MG PO TABS: ORAL_TABLET | ORAL | Status: DC

## 2014-11-21 NOTE — Telephone Encounter (Signed)
Metroprolol, losartan, and atorvastatin RX refills sent to pt pharmacy per dr hoppers request

## 2014-11-21 NOTE — Telephone Encounter (Signed)
Pt called in and said that he thought refills for the follow were going to be sent to [facility].   Metoprolol Losartan  Atorvastation

## 2014-11-21 NOTE — Telephone Encounter (Signed)
Left message advising pt that all 3 refills have been called in to pt pharmacy

## 2014-11-21 NOTE — Telephone Encounter (Signed)
All X 3 mos with 3 refills please

## 2014-12-01 ENCOUNTER — Telehealth: Payer: Self-pay | Admitting: Internal Medicine

## 2014-12-01 DIAGNOSIS — O10019 Pre-existing essential hypertension complicating pregnancy, unspecified trimester: Secondary | ICD-10-CM

## 2014-12-01 MED ORDER — METOPROLOL TARTRATE 100 MG PO TABS: ORAL_TABLET | ORAL | Status: DC

## 2014-12-01 MED ORDER — LOSARTAN POTASSIUM 100 MG PO TABS
ORAL_TABLET | ORAL | Status: DC
Start: 2014-12-01 — End: 2015-11-10

## 2014-12-01 NOTE — Telephone Encounter (Signed)
[name] called regarding prescriptions losartan (COZAAR) 100 MG tablet [[id number]] and metoprolol (LOPRESSOR) 100 MG tablet [[id number]. Patient thought these were being called in to the pharmacy. I seen they were print. Could you please fax these to the pharmacy.

## 2014-12-01 NOTE — Telephone Encounter (Signed)
Resent electronically to [facility]...Russell [name]

## 2015-07-27 DIAGNOSIS — J019 Acute sinusitis, unspecified: Secondary | ICD-10-CM | POA: Diagnosis not present

## 2015-11-10 ENCOUNTER — Other Ambulatory Visit (INDEPENDENT_AMBULATORY_CARE_PROVIDER_SITE_OTHER): Payer: Medicare Other

## 2015-11-10 ENCOUNTER — Ambulatory Visit (INDEPENDENT_AMBULATORY_CARE_PROVIDER_SITE_OTHER): Payer: Medicare Other | Admitting: Internal Medicine

## 2015-11-10 ENCOUNTER — Encounter: Payer: Self-pay | Admitting: Internal Medicine

## 2015-11-10 VITALS — BP 150/90 | HR 57 | Temp 98.5°F | Resp 16 | Ht 68.0 in | Wt 238.0 lb

## 2015-11-10 DIAGNOSIS — E785 Hyperlipidemia, unspecified: Secondary | ICD-10-CM | POA: Diagnosis not present

## 2015-11-10 DIAGNOSIS — Z23 Encounter for immunization: Secondary | ICD-10-CM

## 2015-11-10 DIAGNOSIS — E669 Obesity, unspecified: Secondary | ICD-10-CM

## 2015-11-10 DIAGNOSIS — Z8546 Personal history of malignant neoplasm of prostate: Secondary | ICD-10-CM

## 2015-11-10 DIAGNOSIS — O10019 Pre-existing essential hypertension complicating pregnancy, unspecified trimester: Secondary | ICD-10-CM

## 2015-11-10 DIAGNOSIS — Z0000 Encounter for general adult medical examination without abnormal findings: Secondary | ICD-10-CM

## 2015-11-10 DIAGNOSIS — I10 Essential (primary) hypertension: Secondary | ICD-10-CM | POA: Diagnosis not present

## 2015-11-10 LAB — CBC WITH DIFFERENTIAL/PLATELET
BASOS ABS: 0 10*3/uL (ref 0.0–0.1)
BASOS PCT: 0.7 % (ref 0.0–3.0)
EOS ABS: 0.2 10*3/uL (ref 0.0–0.7)
Eosinophils Relative: 3.1 % (ref 0.0–5.0)
HEMATOCRIT: 43.2 % (ref 39.0–52.0)
Hemoglobin: 14.8 g/dL (ref 13.0–17.0)
LYMPHS PCT: 33.9 % (ref 12.0–46.0)
Lymphs Abs: 1.9 10*3/uL (ref 0.7–4.0)
MCHC: 34.3 g/dL (ref 30.0–36.0)
MCV: 91.7 fl (ref 78.0–100.0)
MONO ABS: 0.5 10*3/uL (ref 0.1–1.0)
Monocytes Relative: 9.5 % (ref 3.0–12.0)
NEUTROS ABS: 2.9 10*3/uL (ref 1.4–7.7)
NEUTROS PCT: 52.8 % (ref 43.0–77.0)
PLATELETS: 234 10*3/uL (ref 150.0–400.0)
RBC: 4.71 Mil/uL (ref 4.22–5.81)
RDW: 13.3 % (ref 11.5–15.5)
WBC: 5.5 10*3/uL (ref 4.0–10.5)

## 2015-11-10 LAB — LIPID PANEL
CHOL/HDL RATIO: 3
Cholesterol: 166 mg/dL (ref 0–200)
HDL: 61.8 mg/dL (ref >39.00)
LDL CALC: 86 mg/dL (ref 0–99)
NONHDL: 104.6
Triglycerides: 92 mg/dL (ref 0.0–149.0)
VLDL: 18.4 mg/dL (ref 0.0–40.0)

## 2015-11-10 LAB — COMPREHENSIVE METABOLIC PANEL
ALT: 19 U/L (ref 0–53)
AST: 23 U/L (ref 0–37)
Albumin: 4.4 g/dL (ref 3.5–5.2)
Alkaline Phosphatase: 77 U/L (ref 39–117)
BUN: 20 mg/dL (ref 6–23)
CHLORIDE: 104 meq/L (ref 96–112)
CO2: 30 meq/L (ref 19–32)
Calcium: 9.4 mg/dL (ref 8.4–10.5)
Creatinine, Ser: 1.02 mg/dL (ref 0.40–1.50)
GFR: 75.93 mL/min (ref >60.00)
GLUCOSE: 99 mg/dL (ref 70–99)
POTASSIUM: 4.7 meq/L (ref 3.5–5.1)
SODIUM: 140 meq/L (ref 135–145)
TOTAL PROTEIN: 7.3 g/dL (ref 6.0–8.3)
Total Bilirubin: 0.6 mg/dL (ref 0.2–1.2)

## 2015-11-10 LAB — HEMOGLOBIN A1C: Hgb A1c MFr Bld: 5.4 % (ref 4.6–6.5)

## 2015-11-10 LAB — TSH: TSH: 1.87 u[IU]/mL (ref 0.35–4.50)

## 2015-11-10 MED ORDER — LOSARTAN POTASSIUM 100 MG PO TABS: ORAL_TABLET | ORAL | Status: DC

## 2015-11-10 MED ORDER — METOPROLOL TARTRATE 100 MG PO TABS: ORAL_TABLET | ORAL | Status: DC

## 2015-11-10 MED ORDER — ATORVASTATIN CALCIUM 20 MG PO TABS: ORAL_TABLET | ORAL | Status: DC

## 2015-11-10 NOTE — Progress Notes (Signed)
Pre visit review using our clinic review tool, if applicable. No additional management support is needed unless otherwise documented below in the visit note. 

## 2015-11-10 NOTE — Progress Notes (Signed)
Subjective:    Patient ID: Russell [name], male    DOB: Dec 19, 1940, 75 y.o.   MRN: [id number]  HPI He is here to establish with a new pcp.   He is here for a wellness/cpe.  Here for medicare wellness exam.   I have personally reviewed and have noted 1.The patient's medical and social history 2.Their use of alcohol, tobacco or illicit drugs 3.Their current medications and supplements 4.The patient's functional ability including ADL's, fall risks, home safety risks and                 hearing or visual impairment. 5.Diet and physical activities 6.Evidence for depression or mood disorders   Are there smokers in your home (other than you)? No  Risk Factors Exercise: regular 4-5 days ago Dietary issues discussed: well balanced overall  Cardiac risk factors: advanced age (older than 35 for men, 42 for women), hypertension, hyperlipidemia, and obesity (BMI >= 30 kg/m2).  Depression Screen  Have you felt down, depressed or hopeless? No  Have you felt little interest or pleasure in doing things?  No Activities of Daily Living In your present state of health, do you have any difficulty performing the following activities?:  Driving? No Managing money?  No Feeding yourself? No Getting from bed to chair? No Climbing a flight of stairs? No Preparing food and eating?: No Bathing or showering? No Getting dressed: No Getting to/using the toilet? No Moving around from place to place: No In the past year have you fallen or had a near fall?: No   Are you sexually active?  No  Do you have more than one partner?  N/A  Hearing Difficulties: No Do you often ask people to speak up or repeat themselves? No Do you experience ringing or noises in your ears? No Do you have difficulty understanding soft or whispered voices? No Vision:              Any change in vision:  No              Up to date with eye exam:  Up to  date Memory:  Do you feel that you have a problem with memory? No  Do you often misplace items? No  Do you feel safe at home?  Yes  Cognitive Testing  Alert, Orientated? Yes  Normal Appearance? Yes  Recall of three objects?  Yes  Can perform simple calculations? Yes  Displays appropriate judgment? Yes  Can read the correct time from a watch face? Yes   Advanced Directives have been discussed with the patient? Yes  Medications and allergies reviewed with patient and updated if appropriate.  Patient Active Problem List   Diagnosis Date Noted  . Obesity (BMI 30-39.9) 11/04/2013  . DIVERTICULOSIS, COLON 08/31/2010  . 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 Hyperlipidemia 07/17/2008  . Essential hypertension 07/17/2008  . PROSTATE CANCER, HX OF 07/17/2008  . SKIN CANCER, HX OF 07/17/2008  . History of colonic polyps 07/17/2008    Current Outpatient Prescriptions on File Prior to Visit  Medication Sig Dispense Refill  . aspirin 81 MG tablet Take 160 mg by mouth daily.     No current facility-administered medications on file prior to visit.    Past Medical History  Diagnosis Date  . History of prostate cancer     Dr.Peterson  . Hx of colonic polyp     Dr Sharlett Iles  . Hypertension   . History of skin cancer  basal cell, Dr.Turner   . Hypertensive encephalopathy     08/01/2009-08/03/2009  . Diverticulosis   . Hyperlipidemia     NMR 2011, LDL 103 (1410/120),HDL 73, TG 76. LDL goal =<130    Past Surgical History  Procedure Laterality Date  . Colonoscopy w/ polypectomy      2000, Neg [id number] & 2014. Dr.Patterson  . Prostatectomy  2002    Radiation treatmens 2002-2003  . Tonsillectomy    . Cataract extraction  2010    OD, Dr.Digby    Social History   Social History  . Marital Status: Married    Spouse Name: N/A  . Number of Children: N/A  . Years of Education: N/A   Social History Main Topics  . Smoking status: Former Smoker    Quit date: 08/15/1978   . Smokeless tobacco: Never Used     Comment: smoked age [age], up to 1ppd  . Alcohol Use: 6.0 oz/week    10 Glasses of wine per week  . Drug Use: No  . Sexual Activity: Not Asked   Other Topics Concern  . None   Social History Narrative   Exercise- 4-5 days a week - walking or fitness center    Family History  Problem Relation Age of Onset  . Atrial fibrillation Father   . Colon cancer Mother 42  . Prostate cancer Brother 73  . Heart attack Maternal Grandfather 68  . Pancreatic cancer Maternal Grandmother   . Stomach cancer Paternal Grandfather   . Diabetes Neg Hx   . Stroke Neg Hx   . Esophageal cancer Neg Hx   . Rectal cancer Neg Hx     Review of Systems  Constitutional: Negative for fever, chills, appetite change, fatigue and unexpected weight change.  HENT: Negative for hearing loss.   Eyes: Negative for visual disturbance.  Respiratory: Negative for cough, shortness of breath and wheezing.   Cardiovascular: Positive for leg swelling (left ankle). Negative for chest pain and palpitations.  Gastrointestinal: Negative for nausea, abdominal pain, diarrhea, constipation and blood in stool.       No gerd  Genitourinary: Negative for dysuria and hematuria.  Musculoskeletal: Positive for back pain (lower back, stiffness). Negative for myalgias and arthralgias.  Skin: Negative for color change and rash.  Neurological: Negative for dizziness, weakness, light-headedness, numbness and headaches.  Psychiatric/Behavioral: Negative for dysphoric mood. The patient is not nervous/anxious.        Objective:   Filed Vitals:   11/10/15 0800  BP: 150/90  Pulse: 57  Temp: 98.5 F (36.9 C)  Resp: 16   Filed Weights   11/10/15 0800  Weight: 238 lb (107.956 kg)   Body mass index is 36.2 kg/(m^2).   Physical Exam Constitutional: He appears well-developed and well-nourished. No distress.  HENT:  Head: Normocephalic and atraumatic.  Right Ear: External ear normal.  Left  Ear: External ear normal.  Mouth/Throat: Oropharynx is clear and moist.  Normal ear canals and TM b/l  Eyes: Conjunctivae and EOM are normal.  Neck: Neck supple. No tracheal deviation present. No thyromegaly present.  No carotid bruit  Cardiovascular: Normal rate, regular rhythm, normal heart sounds and intact distal pulses.   No murmur heard. Pulmonary/Chest: Effort normal and breath sounds normal. No respiratory distress. He has no wheezes. He has no rales.  Abdominal: Soft. Bowel sounds are normal. He exhibits no distension. There is no tenderness.  Genitourinary: deferred  Musculoskeletal: B/L LE edema, 1+ pitting Lymphadenopathy:    He has no  cervical adenopathy.  Skin: Skin is warm and dry. He is not diaphoretic.  Psychiatric: He has a normal mood and affect. His behavior is normal.         Assessment & Plan:   Wellness Exam: Immunizations prevnar today, discussed shingles vaccine Colonoscopy  Up to date  Eye exam -  Up to date, no changes in vision Hearing loss - no concerns Memory concerns/difficulties - no memory difficulties, still working part-time Independent of ADLs - no difficulty doing any ADL's Advanced directives in place  Physical exam: Screening blood work ordered Immunizations prevnar today, does not think he has had chicken pox - discussed concern with chickenpox as an adult - will think about vaccination, but not concerned Colonoscopy  Up to date  Eye exams  Up to date  EKG done 2015, reviewed - no need to repeat today Exercise - regular Weight - overweight - discussed decreasing portions - encouraged weight loss Skin   - sees derm yearly, up to date Substance abuse - no evidence of abuse    See Problem List for Assessment and Plan of chronic medical problems.  Follow up annually, sooner if needed especially if BP is not controlled

## 2015-11-10 NOTE — Assessment & Plan Note (Signed)
BP elevated here today Will start monitoring it at home Work on weight loss Check basic blood work

## 2015-11-10 NOTE — Patient Instructions (Addendum)
Russell [name] , Thank you for taking time to come for your Medicare Wellness Visit. I appreciate your ongoing commitment to your health goals. Please review the following plan we discussed and let me know if I can assist you in the future.   These are the goals we discussed: Goals    Decrease portions and work on weight loss.  Monitor your BP.      This is a list of the screening recommended for you and due dates:  Health Maintenance  Topic Date Due  . Shingles Vaccine  01/29/2002  . Pneumonia vaccines (1 of 2 - PCV13) Given today  . Flu Shot  03/15/2016*  . Colon Cancer Screening  12/19/2017  . Tetanus Vaccine  09/12/2021  *Topic was postponed. The date shown is not the original due date.   Health Maintenance, Male A healthy lifestyle and preventative care can promote health and wellness.  Maintain regular health, dental, and eye exams.  Eat a healthy diet. Foods like vegetables, fruits, whole grains, low-fat dairy products, and lean protein foods contain the nutrients you need and are low in calories. Decrease your intake of foods high in solid fats, added sugars, and salt. Get information about a proper diet from your health care provider, if necessary.  Regular physical exercise is one of the most important things you can do for your health. Most adults should get at least 150 minutes of moderate-intensity exercise (any activity that increases your heart rate and causes you to sweat) each week. In addition, most adults need muscle-strengthening exercises on 2 or more days a week.   Maintain a healthy weight. The body mass index (BMI) is a screening tool to identify possible weight problems. It provides an estimate of body fat based on height and weight. Your health care provider can find your BMI and can help you achieve or maintain a healthy weight. For males 20 years and older:  A BMI below 18.5 is considered underweight.  A BMI of 18.5 to 24.9 is normal.  A BMI of 25 to 29.9  is considered overweight.  A BMI of 30 and above is considered obese.  Maintain normal blood lipids and cholesterol by exercising and minimizing your intake of saturated fat. Eat a balanced diet with plenty of fruits and vegetables. Blood tests for lipids and cholesterol should begin at age 23 and be repeated every 5 years. If your lipid or cholesterol levels are high, you are over age 41, or you are at high risk for heart disease, you may need your cholesterol levels checked more frequently.Ongoing high lipid and cholesterol levels should be treated with medicines if diet and exercise are not working.  If you smoke, find out from your health care provider how to quit. If you do not use tobacco, do not start.  Lung cancer screening is recommended for adults aged 66-80 years who are at high risk for developing lung cancer because of a history of smoking. A yearly low-dose CT scan of the lungs is recommended for people who have at least a 30-pack-year history of smoking and are current smokers or have quit within the past 15 years. A pack year of smoking is smoking an average of 1 pack of cigarettes a day for 1 year (for example, a 30-pack-year history of smoking could mean smoking 1 pack a day for 30 years or 2 packs a day for 15 years). Yearly screening should continue until the smoker has stopped smoking for at least 15 years.  Yearly screening should be stopped for people who develop a health problem that would prevent them from having lung cancer treatment.  If you choose to drink alcohol, do not have more than 2 drinks per day. One drink is considered to be 12 oz (360 mL) of beer, 5 oz (150 mL) of wine, or 1.5 oz (45 mL) of liquor.  Avoid the use of street drugs. Do not share needles with anyone. Ask for help if you need support or instructions about stopping the use of drugs.  High blood pressure causes heart disease and increases the risk of stroke. High blood pressure is more likely to develop  in:  People who have blood pressure in the end of the normal range (100-139/85-89 mm Hg).  People who are overweight or obese.  People who are African American.  If you are 21-58 years of age, have your blood pressure checked every 3-5 years. If you are 98 years of age or older, have your blood pressure checked every year. You should have your blood pressure measured twice--once when you are at a hospital or clinic, and once when you are not at a hospital or clinic. Record the average of the two measurements. To check your blood pressure when you are not at a hospital or clinic, you can use:  An automated blood pressure machine at a pharmacy.  A home blood pressure monitor.  If you are 12-62 years old, ask your health care provider if you should take aspirin to prevent heart disease.  Diabetes screening involves taking a blood sample to check your fasting blood sugar level. This should be done once every 3 years after age 58 if you are at a normal weight and without risk factors for diabetes. Testing should be considered at a younger age or be carried out more frequently if you are overweight and have at least 1 risk factor for diabetes.  Colorectal cancer can be detected and often prevented. Most routine colorectal cancer screening begins at the age of 22 and continues through age 33. However, your health care provider may recommend screening at an earlier age if you have risk factors for colon cancer. On a yearly basis, your health care provider may provide home test kits to check for hidden blood in the stool. A small camera at the end of a tube may be used to directly examine the colon (sigmoidoscopy or colonoscopy) to detect the earliest forms of colorectal cancer. Talk to your health care provider about this at age 28 when routine screening begins. A direct exam of the colon should be repeated every 5-10 years through age 18, unless early forms of precancerous polyps or small growths are  found.  People who are at an increased risk for hepatitis B should be screened for this virus. You are considered at high risk for hepatitis B if:  You were born in a country where hepatitis B occurs often. Talk with your health care provider about which countries are considered high risk.  Your parents were born in a high-risk country and you have not received a shot to protect against hepatitis B (hepatitis B vaccine).  You have HIV or AIDS.  You use needles to inject street drugs.  You live with, or have sex with, someone who has hepatitis B.  You are a man who has sex with other men (MSM).  You get hemodialysis treatment.  You take certain medicines for conditions like cancer, organ transplantation, and autoimmune conditions.  Hepatitis C  blood testing is recommended for all people born from [birth year] through 1964 and any individual with known risk factors for hepatitis C.  Healthy men should no longer receive prostate-specific antigen (PSA) blood tests as part of routine cancer screening. Talk to your health care provider about prostate cancer screening.  Testicular cancer screening is not recommended for adolescents or adult males who have no symptoms. Screening includes self-exam, a health care provider exam, and other screening tests. Consult with your health care provider about any symptoms you have or any concerns you have about testicular cancer.  Practice safe sex. Use condoms and avoid high-risk sexual practices to reduce the spread of sexually transmitted infections (STIs).  You should be screened for STIs, including gonorrhea and chlamydia if:  You are sexually active and are younger than 24 years.  You are older than 24 years, and your health care provider tells you that you are at risk for this type of infection.  Your sexual activity has changed since you were last screened, and you are at an increased risk for chlamydia or gonorrhea. Ask your health care provider if  you are at risk.  If you are at risk of being infected with HIV, it is recommended that you take a prescription medicine daily to prevent HIV infection. This is called pre-exposure prophylaxis (PrEP). You are considered at risk if:  You are a man who has sex with other men (MSM).  You are a heterosexual man who is sexually active with multiple partners.  You take drugs by injection.  You are sexually active with a partner who has HIV.  Talk with your health care provider about whether you are at high risk of being infected with HIV. If you choose to begin PrEP, you should first be tested for HIV. You should then be tested every 3 months for as long as you are taking PrEP.  Use sunscreen. Apply sunscreen liberally and repeatedly throughout the day. You should seek shade when your shadow is shorter than you. Protect yourself by wearing long sleeves, pants, a wide-brimmed hat, and sunglasses year round whenever you are outdoors.  Tell your health care provider of new moles or changes in moles, especially if there is a change in shape or color. Also, tell your health care provider if a mole is larger than the size of a pencil eraser.  A one-time screening for abdominal aortic aneurysm (AAA) and surgical repair of large AAAs by ultrasound is recommended for men aged 55-75 years who are current or former smokers.  Stay current with your vaccines (immunizations).   This information is not intended to replace advice given to you by your health care provider. Make sure you discuss any questions you have with your health care provider.   Document Released: 01/28/2008 Document Revised: 08/22/2014 Document Reviewed: 12/27/2010 Elsevier Interactive Patient Education [employer].

## 2015-11-10 NOTE — Assessment & Plan Note (Addendum)
Check psa Does not follow with urology

## 2015-11-10 NOTE — Assessment & Plan Note (Signed)
Decrease portions, work on weight loss Continue regular exercise

## 2015-11-10 NOTE — Assessment & Plan Note (Signed)
On lipitor Check lipids, cmp

## 2015-11-11 LAB — PSA, TOTAL AND FREE
PSA, Free Pct: 33 % (ref >25)
PSA, Free: <0.1 ng/mL
PSA: 0.03 ng/mL (ref <=4.00)

## 2016-11-09 ENCOUNTER — Encounter: Payer: Self-pay | Admitting: Internal Medicine

## 2016-11-09 ENCOUNTER — Ambulatory Visit (INDEPENDENT_AMBULATORY_CARE_PROVIDER_SITE_OTHER): Payer: Medicare Other | Admitting: Internal Medicine

## 2016-11-09 VITALS — BP 142/100 | HR 59 | Temp 98.3°F | Resp 18 | Ht 68.0 in | Wt 236.0 lb

## 2016-11-09 DIAGNOSIS — I10 Essential (primary) hypertension: Secondary | ICD-10-CM

## 2016-11-09 DIAGNOSIS — E669 Obesity, unspecified: Secondary | ICD-10-CM | POA: Diagnosis not present

## 2016-11-09 DIAGNOSIS — E7800 Pure hypercholesterolemia, unspecified: Secondary | ICD-10-CM | POA: Diagnosis not present

## 2016-11-09 DIAGNOSIS — Z23 Encounter for immunization: Secondary | ICD-10-CM | POA: Diagnosis not present

## 2016-11-09 DIAGNOSIS — Z974 Presence of external hearing-aid: Secondary | ICD-10-CM | POA: Insufficient documentation

## 2016-11-09 DIAGNOSIS — Z8546 Personal history of malignant neoplasm of prostate: Secondary | ICD-10-CM

## 2016-11-09 DIAGNOSIS — Z0000 Encounter for general adult medical examination without abnormal findings: Secondary | ICD-10-CM | POA: Diagnosis not present

## 2016-11-09 NOTE — Progress Notes (Signed)
Subjective:    Patient ID: Russell [name], male    DOB: 05/24/1941, 76 y.o.   MRN: [id number]  HPI Here for medicare wellness exam and a physical exam.   I have personally reviewed and have noted 1.The patient's medical and social history 2.Their use of alcohol, tobacco or illicit drugs 3.Their current medications and supplements 4.The patient's functional ability including ADL's, fall risks, home safety risks and hearing or visual impairment. 5.Diet and physical activities 6.Evidence for depression or mood disorders 7.Care team reviewed  - dermatology, eye doctor   Are there smokers in your home (other than you)? No  Risk Factors Exercise: goes to gym couple of time a week, walking in warmer weather Dietary issues discussed:  Eats a lot of carbs, lots of veges and fruits  Cardiac risk factors: advanced age, hypertension, hyperlipidemia, and obesity (BMI >= 30 kg/m2).  Depression Screen  Have you felt down, depressed or hopeless? No  Have you felt little interest or pleasure in doing things?  No  Activities of Daily Living In your present state of health, do you have any difficulty performing the following activities?:  Driving? No Managing money?  No Feeding yourself? No Getting from bed to chair? No Climbing a flight of stairs? No Preparing food and eating?: No Bathing or showering? No Getting dressed: No Getting to/using the toilet? No Moving around from place to place: No In the past year have you fallen or had a near fall?: yes, on ice   Are you sexually active?  yes  Do you have more than one partner?  no  Hearing Difficulties: yes hearing aids Do you often ask people to speak up or repeat themselves? yes Do you experience ringing or noises in your ears? No Do you have difficulty understanding soft or whispered voices? yes Vision:              Any change in vision:  no             Up to  date with eye exam:  yes Memory:  Do you feel that you have a problem with memory? Yes, with recall only  Do you often misplace items? No  Do you feel safe at home?  Yes  Cognitive Testing  Alert, Orientated? Yes  Normal Appearance? Yes  Recall of three objects?  Yes  Can perform simple calculations? Yes  Displays appropriate judgment? Yes  Can read the correct time from a watch face? Yes   Advanced Directives have been discussed with the patient? Yes - in place   Medications and allergies reviewed with patient and updated if appropriate.  Patient Active Problem List   Diagnosis Date Noted  . Obesity (BMI 30-39.9) 11/04/2013  . DIVERTICULOSIS, COLON 08/31/2010  . 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 Hyperlipidemia 07/17/2008  . Essential hypertension 07/17/2008  . PROSTATE CANCER, HX OF 07/17/2008  . SKIN CANCER, HX OF 07/17/2008  . History of colonic polyps 07/17/2008    Current Outpatient Prescriptions on File Prior to Visit  Medication Sig Dispense Refill  . aspirin 81 MG tablet Take 160 mg by mouth daily.    Marland Kitchen atorvastatin (LIPITOR) 20 MG tablet TAKE ONE TABLET EACH DAY IN PLACE OF PRAVASTATIN 90 tablet 3  . losartan (COZAAR) 100 MG tablet TAKE ONE TABLET BY MOUTH ONCE DAILY 90 tablet 3  . metoprolol (LOPRESSOR) 100 MG tablet TAKE 1/2 TABLET TWICE DAILY 90 tablet 3   No current facility-administered medications on file prior to  visit.     Past Medical History:  Diagnosis Date  . Diverticulosis   . History of prostate cancer    Dr.Peterson  . History of skin cancer    basal cell, Dr.Turner   . Hx of colonic polyp    Dr Sharlett Iles  . Hyperlipidemia    NMR 2011, LDL 103 (1410/120),HDL 73, TG 76. LDL goal =<130  . Hypertension   . Hypertensive encephalopathy    08/01/2009-08/03/2009    Past Surgical History:  Procedure Laterality Date  . CATARACT EXTRACTION  2010   OD, Dr.Digby  . COLONOSCOPY W/ POLYPECTOMY     2000, Neg 3810,1751 & 2014. Dr.Patterson    . PROSTATECTOMY  2002   Radiation treatmens 2002-2003  . TONSILLECTOMY      Social History   Social History  . Marital status: Married    Spouse name: N/A  . Number of children: N/A  . Years of education: N/A   Social History Main Topics  . Smoking status: Former Smoker    Quit date: 08/15/1978  . Smokeless tobacco: Never Used     Comment: smoked age [age], up to 1ppd  . Alcohol use 6.0 oz/week    10 Glasses of wine per week  . Drug use: No  . Sexual activity: Not Asked   Other Topics Concern  . None   Social History Narrative   Exercise- 4-5 days a week - walking or fitness center    Family History  Problem Relation Age of Onset  . Atrial fibrillation Father   . Colon cancer Mother 40  . Prostate cancer Brother 21  . Heart attack Maternal Grandfather 68  . Pancreatic cancer Maternal Grandmother   . Stomach cancer Paternal Grandfather   . Diabetes Neg Hx   . Stroke Neg Hx   . Esophageal cancer Neg Hx   . Rectal cancer Neg Hx     Review of Systems  Constitutional: Negative for appetite change, chills, fatigue, fever and unexpected weight change.  HENT: Positive for hearing loss. Negative for tinnitus.   Eyes: Negative for visual disturbance.  Respiratory: Negative for cough, shortness of breath and wheezing.   Cardiovascular: Positive for leg swelling (occ left ankle). Negative for chest pain and palpitations.  Gastrointestinal: Negative for abdominal pain, blood in stool, constipation and nausea.       No gerd  Genitourinary: Negative for difficulty urinating, dysuria and hematuria.  Musculoskeletal: Negative for arthralgias and back pain.  Skin: Negative for color change and rash.  Neurological: Negative for dizziness, light-headedness and headaches.  Psychiatric/Behavioral: Negative for dysphoric mood and sleep disturbance. The patient is not nervous/anxious.        Objective:   Vitals:   11/09/16 1308  BP: (!) 142/100  Pulse: (!) 59  Resp: 18   Temp: 98.3 F (36.8 C)   Filed Weights   11/09/16 1308  Weight: 236 lb (107 kg)   Body mass index is 35.88 kg/m.  Wt Readings from Last 3 Encounters:  11/09/16 236 lb (107 kg)  11/10/15 238 lb (108 kg)  11/06/14 230 lb (104.3 kg)     Physical Exam Constitutional: He appears well-developed and well-nourished. No distress.  HENT:  Head: Normocephalic and atraumatic.  Right Ear: External ear normal.  Left Ear: External ear normal.  Mouth/Throat: Oropharynx is clear and moist.  Normal ear canals and TM b/l  Eyes: Conjunctivae and EOM are normal.  Neck: Neck supple. No tracheal deviation present. No thyromegaly present.  No carotid  bruit  Cardiovascular: Normal rate, regular rhythm, normal heart sounds and intact distal pulses.   No murmur heard.  1+ pitting edema b/l LE Pulmonary/Chest: Effort normal and breath sounds normal. No respiratory distress. He has no wheezes. He has no rales.  Abdominal: Soft. Obese. He exhibits no distension. There is no tenderness.  Genitourinary:  deferred  Lymphadenopathy:    He has no cervical adenopathy.  Skin: Skin is warm and dry. He is not diaphoretic.  Psychiatric: He has a normal mood and affect. His behavior is normal.       Assessment & Plan:   Wellness Exam: Immunizations pneumovax today Colonoscopy  Up to date  Eye exam  Up to date  Hearing loss yes- has hearing aids Memory concerns/difficulties Independent of ADLs   fully Stressed the importance of regular exercise   Patient received copy of preventative screening tests/immunizations recommended for the next 5-10 years.   Physical exam: Screening blood work   ordered Immunizations  pneumovax today Colonoscopy   Up to date  Eye exams  Up to date  EKG last EKG 2015 Exercise - regular - increase walking Weight  Advised weight loss Skin  No concerns, sees derm every other year Substance abuse  See Problem List for Assessment and Plan of chronic medical  problems.  FU annually, sooner if BP not controlled

## 2016-11-09 NOTE — Assessment & Plan Note (Signed)
Not currently following with urology  Will check psa

## 2016-11-09 NOTE — Assessment & Plan Note (Signed)
Check lipid panel  Continue daily statin Regular exercise and healthy diet encouraged  

## 2016-11-09 NOTE — Assessment & Plan Note (Signed)
Stressed weight loss Increase exercise Dec portions, dec carbs

## 2016-11-09 NOTE — Patient Instructions (Addendum)
Russell [name] , Thank you for taking time to come for your Medicare Wellness Visit. I appreciate your ongoing commitment to your health goals. Please review the following plan we discussed and let me know if I can assist you in the future.   These are the goals we discussed: Goals    Increase exercise, work on weight loss, monitor BP - goal < 140/90      This is a list of the screening recommended for you and due dates:  Health Maintenance  Topic Date Due  . Flu Shot  03/15/2016  . Pneumonia vaccines (2 of 2 - PPSV23) 11/09/2016  . Colon Cancer Screening  12/19/2017  . Tetanus Vaccine  09/12/2021     Test(s) ordered today. Your results will be released to [place] (or called to you) after review, usually within 72hours after test completion. If any changes need to be made, you will be notified at that same time.  All other Health Maintenance issues reviewed.   All recommended immunizations and age-appropriate screenings are up-to-date or discussed.  Pneumovax immunization administered today.   Medications reviewed and updated.   No changes recommended at this time.    Please followup in one year for a wellness/physical    Health Maintenance, Male A healthy lifestyle and preventive care is important for your health and wellness. Ask your health care provider about what schedule of regular examinations is right for you. What should I know about weight and diet?  Eat a Healthy Diet  Eat plenty of vegetables, fruits, whole grains, low-fat dairy products, and lean protein.  Do not eat a lot of foods high in solid fats, added sugars, or salt. Maintain a Healthy Weight  Regular exercise can help you achieve or maintain a healthy weight. You should:  Do at least 150 minutes of exercise each week. The exercise should increase your heart rate and make you sweat (moderate-intensity exercise).  Do strength-training exercises at least twice a week. Watch Your Levels of Cholesterol  and Blood Lipids  Have your blood tested for lipids and cholesterol every 5 years starting at 75 years of age. If you are at high risk for heart disease, you should start having your blood tested when you are 74 years old. You may need to have your cholesterol levels checked more often if:  Your lipid or cholesterol levels are high.  You are older than 75 years of age.  You are at high risk for heart disease. What should I know about cancer screening? Many types of cancers can be detected early and may often be prevented. Lung Cancer  You should be screened every year for lung cancer if:  You are a current smoker who has smoked for at least 30 years.  You are a former smoker who has quit within the past 15 years.  Talk to your health care provider about your screening options, when you should start screening, and how often you should be screened. Colorectal Cancer  Routine colorectal cancer screening usually begins at 75 years of age and should be repeated every 5-10 years until you are 75 years old. You may need to be screened more often if early forms of precancerous polyps or small growths are found. Your health care provider may recommend screening at an earlier age if you have risk factors for colon cancer.  Your health care provider may recommend using home test kits to check for hidden blood in the stool.  A small camera at the end  of a tube can be used to examine your colon (sigmoidoscopy or colonoscopy). This checks for the earliest forms of colorectal cancer. Prostate and Testicular Cancer  Depending on your age and overall health, your health care provider may do certain tests to screen for prostate and testicular cancer.  Talk to your health care provider about any symptoms or concerns you have about testicular or prostate cancer. Skin Cancer  Check your skin from head to toe regularly.  Tell your health care provider about any new moles or changes in moles, especially  if:  There is a change in a mole's size, shape, or color.  You have a mole that is larger than a pencil eraser.  Always use sunscreen. Apply sunscreen liberally and repeat throughout the day.  Protect yourself by wearing long sleeves, pants, a wide-brimmed hat, and sunglasses when outside. What should I know about heart disease, diabetes, and high blood pressure?  If you are 75-41 years of age, have your blood pressure checked every 3-5 years. If you are 57 years of age or older, have your blood pressure checked every year. You should have your blood pressure measured twice-once when you are at a hospital or clinic, and once when you are not at a hospital or clinic. Record the average of the two measurements. To check your blood pressure when you are not at a hospital or clinic, you can use:  An automated blood pressure machine at a pharmacy.  A home blood pressure monitor.  Talk to your health care provider about your target blood pressure.  If you are between 38-50 years old, ask your health care provider if you should take aspirin to prevent heart disease.  Have regular diabetes screenings by checking your fasting blood sugar level.  If you are at a normal weight and have a low risk for diabetes, have this test once every three years after the age of 67.  If you are overweight and have a high risk for diabetes, consider being tested at a younger age or more often.  A one-time screening for abdominal aortic aneurysm (AAA) by ultrasound is recommended for men aged 67-75 years who are current or former smokers. What should I know about preventing infection? Hepatitis B  If you have a higher risk for hepatitis B, you should be screened for this virus. Talk with your health care provider to find out if you are at risk for hepatitis B infection. Hepatitis C  Blood testing is recommended for:  Everyone born from [birth year] through 1964.  Anyone with known risk factors for hepatitis  C. Sexually Transmitted Diseases (STDs)  You should be screened each year for STDs including gonorrhea and chlamydia if:  You are sexually active and are younger than 74 years of age.  You are older than 75 years of age and your health care provider tells you that you are at risk for this type of infection.  Your sexual activity has changed since you were last screened and you are at an increased risk for chlamydia or gonorrhea. Ask your health care provider if you are at risk.  Talk with your health care provider about whether you are at high risk of being infected with HIV. Your health care provider may recommend a prescription medicine to help prevent HIV infection. What else can I do?  Schedule regular health, dental, and eye exams.  Stay current with your vaccines (immunizations).  Do not use any tobacco products, such as cigarettes, chewing tobacco, and  e-cigarettes. If you need help quitting, ask your health care provider.  Limit alcohol intake to no more than 2 drinks per day. One drink equals 12 ounces of beer, 5 ounces of wine, or 1 ounces of hard liquor.  Do not use street drugs.  Do not share needles.  Ask your health care provider for help if you need support or information about quitting drugs.  Tell your health care provider if you often feel depressed.  Tell your health care provider if you have ever been abused or do not feel safe at home. This information is not intended to replace advice given to you by your health care provider. Make sure you discuss any questions you have with your health care provider. Document Released: 01/28/2008 Document Revised: 03/30/2016 Document Reviewed: 05/05/2015 Elsevier Interactive Patient Education  2017 [employer].

## 2016-11-09 NOTE — Assessment & Plan Note (Signed)
BP elevated here today  Start monitoring at home - goal < 140/90 Continue current medications Check labs Increase exercise, work on weight loss

## 2016-11-09 NOTE — Progress Notes (Signed)
Pre visit review using our clinic review tool, if applicable. No additional management support is needed unless otherwise documented below in the visit note. 

## 2016-11-10 ENCOUNTER — Other Ambulatory Visit (INDEPENDENT_AMBULATORY_CARE_PROVIDER_SITE_OTHER): Payer: Medicare Other

## 2016-11-10 DIAGNOSIS — E7800 Pure hypercholesterolemia, unspecified: Secondary | ICD-10-CM

## 2016-11-10 DIAGNOSIS — Z0000 Encounter for general adult medical examination without abnormal findings: Secondary | ICD-10-CM | POA: Diagnosis not present

## 2016-11-10 DIAGNOSIS — Z8546 Personal history of malignant neoplasm of prostate: Secondary | ICD-10-CM | POA: Diagnosis not present

## 2016-11-10 DIAGNOSIS — I10 Essential (primary) hypertension: Secondary | ICD-10-CM

## 2016-11-10 LAB — LIPID PANEL
CHOL/HDL RATIO: 3
CHOLESTEROL: 160 mg/dL (ref 0–200)
HDL: 52.2 mg/dL (ref >39.00)
LDL Cholesterol: 89 mg/dL (ref 0–99)
NonHDL: 107.53
TRIGLYCERIDES: 92 mg/dL (ref 0.0–149.0)
VLDL: 18.4 mg/dL (ref 0.0–40.0)

## 2016-11-10 LAB — COMPREHENSIVE METABOLIC PANEL
ALBUMIN: 4.4 g/dL (ref 3.5–5.2)
ALT: 18 U/L (ref 0–53)
AST: 22 U/L (ref 0–37)
Alkaline Phosphatase: 83 U/L (ref 39–117)
BUN: 20 mg/dL (ref 6–23)
CALCIUM: 8.9 mg/dL (ref 8.4–10.5)
CHLORIDE: 104 meq/L (ref 96–112)
CO2: 28 meq/L (ref 19–32)
CREATININE: 0.97 mg/dL (ref 0.40–1.50)
GFR: 80.24 mL/min (ref >60.00)
Glucose, Bld: 90 mg/dL (ref 70–99)
Potassium: 4.3 mEq/L (ref 3.5–5.1)
Sodium: 140 mEq/L (ref 135–145)
Total Bilirubin: 0.6 mg/dL (ref 0.2–1.2)
Total Protein: 6.7 g/dL (ref 6.0–8.3)

## 2016-11-10 LAB — CBC WITH DIFFERENTIAL/PLATELET
BASOS PCT: 0.7 % (ref 0.0–3.0)
Basophils Absolute: 0 10*3/uL (ref 0.0–0.1)
EOS ABS: 0.2 10*3/uL (ref 0.0–0.7)
EOS PCT: 3.2 % (ref 0.0–5.0)
HEMATOCRIT: 44 % (ref 39.0–52.0)
Hemoglobin: 14.9 g/dL (ref 13.0–17.0)
LYMPHS PCT: 33.7 % (ref 12.0–46.0)
Lymphs Abs: 2.1 10*3/uL (ref 0.7–4.0)
MCHC: 33.9 g/dL (ref 30.0–36.0)
MCV: 92.3 fl (ref 78.0–100.0)
MONOS PCT: 7.2 % (ref 3.0–12.0)
Monocytes Absolute: 0.5 10*3/uL (ref 0.1–1.0)
NEUTROS ABS: 3.5 10*3/uL (ref 1.4–7.7)
Neutrophils Relative %: 55.2 % (ref 43.0–77.0)
Platelets: 210 10*3/uL (ref 150.0–400.0)
RBC: 4.77 Mil/uL (ref 4.22–5.81)
RDW: 12.9 % (ref 11.5–15.5)
WBC: 6.3 10*3/uL (ref 4.0–10.5)

## 2016-11-10 LAB — PSA, MEDICARE: PSA: 0 ng/ml — ABNORMAL LOW (ref 0.10–4.00)

## 2016-11-10 LAB — TSH: TSH: 2.2 u[IU]/mL (ref 0.35–4.50)

## 2016-11-15 ENCOUNTER — Telehealth: Payer: Self-pay | Admitting: Internal Medicine

## 2016-11-15 NOTE — Telephone Encounter (Signed)
Noted  

## 2016-11-15 NOTE — Telephone Encounter (Signed)
Patient has called back in regard to lab results.  Gave patient MD response.  I also am mailing a copy of labs out to patient.

## 2016-11-21 ENCOUNTER — Other Ambulatory Visit: Payer: Self-pay | Admitting: Internal Medicine

## 2016-11-21 DIAGNOSIS — O10019 Pre-existing essential hypertension complicating pregnancy, unspecified trimester: Secondary | ICD-10-CM

## 2017-04-03 ENCOUNTER — Encounter (HOSPITAL_COMMUNITY): Payer: Self-pay

## 2017-04-03 ENCOUNTER — Emergency Department (HOSPITAL_COMMUNITY): Payer: Medicare Other

## 2017-04-03 ENCOUNTER — Telehealth: Payer: Self-pay | Admitting: Internal Medicine

## 2017-04-03 ENCOUNTER — Observation Stay (HOSPITAL_COMMUNITY)
Admission: EM | Admit: 2017-04-03 | Discharge: 2017-04-05 | Disposition: A | Discharge status: Home / Self Care | Payer: Medicare Other | Attending: Family Medicine | Admitting: Family Medicine

## 2017-04-03 DIAGNOSIS — Z79899 Other long term (current) drug therapy: Secondary | ICD-10-CM | POA: Insufficient documentation

## 2017-04-03 DIAGNOSIS — I4891 Unspecified atrial fibrillation: Secondary | ICD-10-CM | POA: Diagnosis not present

## 2017-04-03 DIAGNOSIS — I481 Persistent atrial fibrillation: Principal | ICD-10-CM | POA: Insufficient documentation

## 2017-04-03 DIAGNOSIS — Z6830 Body mass index (BMI) 30.0-30.9, adult: Secondary | ICD-10-CM | POA: Insufficient documentation

## 2017-04-03 DIAGNOSIS — Z8546 Personal history of malignant neoplasm of prostate: Secondary | ICD-10-CM | POA: Diagnosis not present

## 2017-04-03 DIAGNOSIS — I501 Left ventricular failure: Secondary | ICD-10-CM | POA: Diagnosis present

## 2017-04-03 DIAGNOSIS — R0683 Snoring: Secondary | ICD-10-CM | POA: Diagnosis present

## 2017-04-03 DIAGNOSIS — I4819 Other persistent atrial fibrillation: Secondary | ICD-10-CM | POA: Diagnosis present

## 2017-04-03 DIAGNOSIS — I700 Atherosclerosis of aorta: Secondary | ICD-10-CM | POA: Diagnosis present

## 2017-04-03 DIAGNOSIS — Z85828 Personal history of other malignant neoplasm of skin: Secondary | ICD-10-CM | POA: Insufficient documentation

## 2017-04-03 DIAGNOSIS — I509 Heart failure, unspecified: Secondary | ICD-10-CM | POA: Diagnosis not present

## 2017-04-03 DIAGNOSIS — I2510 Atherosclerotic heart disease of native coronary artery without angina pectoris: Secondary | ICD-10-CM

## 2017-04-03 DIAGNOSIS — I110 Hypertensive heart disease with heart failure: Secondary | ICD-10-CM | POA: Insufficient documentation

## 2017-04-03 DIAGNOSIS — I5031 Acute diastolic (congestive) heart failure: Secondary | ICD-10-CM | POA: Diagnosis not present

## 2017-04-03 DIAGNOSIS — I119 Hypertensive heart disease without heart failure: Secondary | ICD-10-CM | POA: Diagnosis present

## 2017-04-03 DIAGNOSIS — Z87891 Personal history of nicotine dependence: Secondary | ICD-10-CM | POA: Insufficient documentation

## 2017-04-03 DIAGNOSIS — Z7982 Long term (current) use of aspirin: Secondary | ICD-10-CM | POA: Insufficient documentation

## 2017-04-03 DIAGNOSIS — E669 Obesity, unspecified: Secondary | ICD-10-CM | POA: Diagnosis present

## 2017-04-03 DIAGNOSIS — R5383 Other fatigue: Secondary | ICD-10-CM | POA: Diagnosis not present

## 2017-04-03 DIAGNOSIS — R0602 Shortness of breath: Secondary | ICD-10-CM | POA: Diagnosis not present

## 2017-04-03 DIAGNOSIS — E785 Hyperlipidemia, unspecified: Secondary | ICD-10-CM | POA: Diagnosis not present

## 2017-04-03 HISTORY — DX: Alcohol use, unspecified, uncomplicated: F10.90

## 2017-04-03 HISTORY — DX: Other problems related to lifestyle: Z72.89

## 2017-04-03 LAB — BASIC METABOLIC PANEL
ANION GAP: 9 (ref 5–15)
BUN: 19 mg/dL (ref 6–20)
CHLORIDE: 108 mmol/L (ref 101–111)
CO2: 20 mmol/L — ABNORMAL LOW (ref 22–32)
Calcium: 8.9 mg/dL (ref 8.9–10.3)
Creatinine, Ser: 1.19 mg/dL (ref 0.61–1.24)
GFR, EST NON AFRICAN AMERICAN: 58 mL/min — AB (ref >60)
Glucose, Bld: 110 mg/dL — ABNORMAL HIGH (ref 65–99)
POTASSIUM: 4.4 mmol/L (ref 3.5–5.1)
SODIUM: 137 mmol/L (ref 135–145)

## 2017-04-03 LAB — CBC
HEMATOCRIT: 44.4 % (ref 39.0–52.0)
Hemoglobin: 14.6 g/dL (ref 13.0–17.0)
MCH: 31.2 pg (ref 26.0–34.0)
MCHC: 32.9 g/dL (ref 30.0–36.0)
MCV: 94.9 fL (ref 78.0–100.0)
Platelets: 238 10*3/uL (ref 150–400)
RBC: 4.68 MIL/uL (ref 4.22–5.81)
RDW: 14 % (ref 11.5–15.5)
WBC: 6.1 10*3/uL (ref 4.0–10.5)

## 2017-04-03 LAB — I-STAT TROPONIN, ED: Troponin i, poc: 0.02 ng/mL (ref 0.00–0.08)

## 2017-04-03 LAB — D-DIMER, QUANTITATIVE (NOT AT ARMC): D DIMER QUANT: 1.74 ug{FEU}/mL — AB (ref 0.00–0.50)

## 2017-04-03 MED ORDER — LOSARTAN POTASSIUM 50 MG PO TABS
100.0000 mg | ORAL_TABLET | Freq: Every day | ORAL | Status: DC
  Administered 2017-04-04 – 2017-04-05 (×2): 100 mg via ORAL
  Filled 2017-04-03 (×2): qty 2

## 2017-04-03 MED ORDER — IOPAMIDOL (ISOVUE-370) INJECTION 76%
INTRAVENOUS | Status: AC
  Administered 2017-04-03: 100 mL
  Filled 2017-04-03: qty 100

## 2017-04-03 MED ORDER — LABETALOL HCL 5 MG/ML IV SOLN
20.0000 mg | Freq: Once | INTRAVENOUS | Status: AC
  Administered 2017-04-03: 20 mg via INTRAVENOUS
  Filled 2017-04-03: qty 4

## 2017-04-03 MED ORDER — LOSARTAN POTASSIUM 50 MG PO TABS: 100.0000 mg | ORAL_TABLET | Freq: Every day | ORAL | Status: DC

## 2017-04-03 MED ORDER — DILTIAZEM HCL 25 MG/5ML IV SOLN
10.0000 mg | Freq: Once | INTRAVENOUS | Status: AC
  Administered 2017-04-03: 10 mg via INTRAVENOUS
  Filled 2017-04-03: qty 5

## 2017-04-03 MED ORDER — DILTIAZEM HCL 100 MG IV SOLR
5.0000 mg/h | INTRAVENOUS | Status: DC
  Administered 2017-04-03: 5 mg/h via INTRAVENOUS
  Administered 2017-04-04: 10 mg/h via INTRAVENOUS
  Filled 2017-04-03 (×2): qty 100

## 2017-04-03 MED ORDER — APIXABAN 5 MG PO TABS
5.0000 mg | ORAL_TABLET | Freq: Two times a day (BID) | ORAL | Status: DC
  Administered 2017-04-03 – 2017-04-05 (×4): 5 mg via ORAL
  Filled 2017-04-03 (×4): qty 1

## 2017-04-03 MED ORDER — ATORVASTATIN CALCIUM 20 MG PO TABS
20.0000 mg | ORAL_TABLET | Freq: Every day | ORAL | Status: DC
  Administered 2017-04-04 – 2017-04-05 (×2): 20 mg via ORAL
  Filled 2017-04-03 (×2): qty 1

## 2017-04-03 MED ORDER — METOPROLOL TARTRATE 25 MG PO TABS: 100.0000 mg | ORAL_TABLET | Freq: Every day | ORAL | Status: DC

## 2017-04-03 MED ORDER — ASPIRIN EC 81 MG PO TBEC
81.0000 mg | DELAYED_RELEASE_TABLET | Freq: Every day | ORAL | Status: DC
  Administered 2017-04-04: 81 mg via ORAL
  Filled 2017-04-03 (×2): qty 1

## 2017-04-03 MED ORDER — METOPROLOL TARTRATE 5 MG/5ML IV SOLN
5.0000 mg | Freq: Once | INTRAVENOUS | Status: AC
  Administered 2017-04-03: 5 mg via INTRAVENOUS
  Filled 2017-04-03: qty 5

## 2017-04-03 MED ORDER — FUROSEMIDE 10 MG/ML IJ SOLN
20.0000 mg | Freq: Once | INTRAMUSCULAR | Status: AC
  Administered 2017-04-03: 20 mg via INTRAVENOUS
  Filled 2017-04-03: qty 2

## 2017-04-03 NOTE — Progress Notes (Signed)
ANTICOAGULATION CONSULT NOTE - Initial Consult  Pharmacy Consult for apixaban Indication: atrial fibrillation  No Known Allergies  Patient Measurements: Height: 5\' 9"  (175.3 cm) Weight: 225 lb (102.1 kg) IBW/kg (Calculated) : 70.7  Vital Signs: Temp: 98.3 F (36.8 C) (08/20 1351) Temp Source: Oral (08/20 1351) BP: 180/142 (08/20 2030) Pulse Rate: 30 (08/20 2030)  Labs:  Recent Labs  04/03/17 1403  HGB 14.6  HCT 44.4  PLT 238  CREATININE 1.19    Estimated Creatinine Clearance: 63.2 mL/min (by C-G formula based on SCr of 1.19 mg/dL).   Medical History: Past Medical History:  Diagnosis Date  . Diverticulosis   . Habitual alcohol use   . History of prostate cancer    Dr.Peterson  . History of skin cancer    basal cell, Dr.Turner   . Hx of colonic polyp    Dr Sharlett Iles  . Hyperlipidemia    NMR 2011, LDL 103 (1410/120),HDL 73, TG 76. LDL goal =<130  . Hypertension   . Hypertensive encephalopathy    08/01/2009-08/03/2009    Medications:   (Not in a hospital admission)  Assessment: 76 YOM with new-onset Afib to start apixaban. H/H and plt wnl. SCr 1.19.   Goal of Therapy:  Stroke prevention Monitor platelets by anticoagulation protocol: Yes   Plan:  -Start apixaban 5 mg twice daily  -Monitor for s/s of bleeding   Tyrease Vandeberg, Darnell Level 04/03/2017,8:47 PM

## 2017-04-03 NOTE — H&P (Signed)
[facility] Admission History and Physical Service Pager: [phone]  Patient name: Russell [name] Medical record number: [id number] Date of birth: [birth year] Age: 76 y.o. Gender: male  Primary Care Provider: Binnie Rail, MD Consultants: Cardiology Code Status: FULL  Chief Complaint: Shortness of breath   Assessment and Plan: ANGELO [name] is a 76 y.o. male presenting with SOB with new onset A-fib and RVR . PMH is significant for HTN, HLD, h/o prostate cancer.   New onset A-Fib with RVR: Patient reporting worsening of SOB for a few weeks. He says his chest has just not felt right.  He has had worsening dyspnea. Does not see a cardiologist. On admission heart rate was in the 140's, RR of 26 and blood pressures elevated to 388'E systolic. EKG showing atrial fibrillation with RVR.    Pt given 10mg  of diltiazem in ED and his rate went into the 80's, still irregularly irregular.  He denies any chest pain or pressure but endorses palpitations.    - Admit to telemetry, attending Dr. McDiarmid  - continuous cardiac monitoring  - cardiology consulted, appreciate recommendations  - Continue home aspirin  - Given 10 mg diltiazem in ED, labetolol 20 mg , metoprolol 5 mg injection, then started on diltiazem gtt  - AM EKG  - AM CBC, BMP - follow up BNP, TSH, PT/ INR - Obtain ECHO - Start Eliquis 5 mg BID , per cards  - Pt to receive eliquis education , per pharmacy  - Vitals per follow protocol  Dyspnea: New onset four weeks ago, now worsening. Patient has had to take it easy, usually very active, was on a vacation to [place] and had to stop hiking. Pt having trouble catching his breath while giving [place] his history. CXR showed small bilateral effusions with underlying atelectasis. Could consider DVT/PE with recent travel in flights however CTA was neg for PE, suspected mild interstitial edema with small bilateral pleural effusions. Coronary atherosclerosis of the  LAD and left circumflex was noted on CTA. Suspect acute CHF, EF unknown vs dyspnea from new onset A-fib. Has not followed with cardiology in the past but will consult.  - Daily weights and Strict I/Os  - Monitor oxygen, if <92% on RA, supplement with [place] - consider OSA work up as outpatient given body habitus  - Consider adding Lasix   HLD: stable. - Continue home Lipitor   HTN: Uncontrolled, pt hypertensive on admission.  On admission with BP 165/116.  At home on Cozaar 100 mg and Lopressor 100 mg daily.  - Continue home Cozaar  - Holding Lopressor, per cards   History of prostate cancer: Resolved. 8 years ago.   FEN/GI: Heart healthy diet Prophylaxis: Eliquis   Disposition: Admit to telemetry, attending Dr. McDiarmid   History of Present Illness:  Russell [name] is a 76 y.o. male presenting with new onset A-fib with RVR.  Shortness of breath which has become progressively worse with exertion over the past few weeks. "Didn't feel right" last night after coming back from vacation in [place]. Was in flight for 5 hours. Has felt like that over past few weeks but has slowly been worsening. Spent his vacation time hiking which had been getting harder and harder for him. Concerned he would have some sort of heart block because he felt some palpitations. Does not follow with cardiology. Denies CP and right leg swelling, however noting some new left leg swelling. Uses 1 pillow at night. Father with history of  a-fib. Not on oxygen at home. Denies headache, blurry vision. Endorses constipation recently with new low-carb diet. Last BM was earlier today however is having small stools with some difficulty and straining.   Review Of Systems: Per HPI with the following additions:   Review of Systems  Respiratory: Positive for shortness of breath.   Cardiovascular: Positive for palpitations. Negative for chest pain and leg swelling.  Gastrointestinal: Negative for nausea and vomiting.  Neurological:  Negative for headaches.   Patient Active Problem List   Diagnosis Date Noted  . Persistent atrial fibrillation ([place]) 04/03/2017  . Aortic atherosclerosis ([place]) 04/03/2017  . CAD (coronary artery disease), native coronary artery 04/03/2017  . Wears hearing aid 11/09/2016  . Obesity (BMI 30-39.9) 11/04/2013  . Hyperlipidemia 07/17/2008  . Hypertensive heart disease 07/17/2008  . History of prostate cancer 07/17/2008   Past Medical History: Past Medical History:  Diagnosis Date  . Diverticulosis   . Habitual alcohol use   . History of prostate cancer    Dr.Peterson  . History of skin cancer    basal cell, Dr.Turner   . Hx of colonic polyp    Dr Sharlett Iles  . Hyperlipidemia    NMR 2011, LDL 103 (1410/120),HDL 73, TG 76. LDL goal =<130  . Hypertension   . Hypertensive encephalopathy    08/01/2009-08/03/2009   Past Surgical History: Past Surgical History:  Procedure Laterality Date  . CATARACT EXTRACTION  2010   OD, Dr.Digby  . COLONOSCOPY W/ POLYPECTOMY     2000, Neg [id number],1027 & 2014. Dr.Patterson  . PROSTATECTOMY  2002   Radiation treatmens 2002-2003  . TONSILLECTOMY     Social History: Social History  Substance Use Topics  . Smoking status: Former Smoker    Quit date: 08/15/1978  . Smokeless tobacco: Never Used     Comment: smoked age [age], up to 1ppd  . Alcohol use 6.0 oz/week    10 Glasses of wine per week     Comment: Drinks 2-3 drinks per day   Additional social history: Former smoker, quit in 1980 - 1 ppd.  Alcohol use - couple of drinks a day.  No illicit drugs. Please also refer to relevant sections of EMR.  Family History: Family History  Problem Relation Age of Onset  . Atrial fibrillation Father   . Colon cancer Mother 54  . Prostate cancer Brother 34  . Heart attack Maternal Grandfather 68  . Pancreatic cancer Maternal Grandmother   . Stomach cancer Paternal Grandfather   . Diabetes Neg Hx   . Stroke Neg Hx   . Esophageal cancer Neg Hx   .  Rectal cancer Neg Hx    Allergies and Medications: No Known Allergies No current facility-administered medications on file prior to encounter.    Current Outpatient Prescriptions on File Prior to Encounter  Medication Sig Dispense Refill  . atorvastatin (LIPITOR) 20 MG tablet TAKE ONE TABLET EACH DAY IN PLACE OF PRAVASTATIN (Patient taking differently: TAKE ONE TABLET(20 MG) BY MOUTH EVERY MORNING) 90 tablet 3  . losartan (COZAAR) 100 MG tablet TAKE ONE TABLET EACH DAY (Patient taking differently: TAKE ONE TABLET (100 MG) BY MOUTH EVERY MORNING) 90 tablet 3  . metoprolol (LOPRESSOR) 100 MG tablet TAKE 1/2 TABLET TWICE DAILY (Patient taking differently: TAKE 1 TABLET (100 MG) BY MOUTH EVERY MORNING) 90 tablet 3   Objective: BP (!) 192/122   Pulse (!) 120   Temp 98.3 F (36.8 C) (Oral)   Resp (!) 30   Ht 5'  9" (1.753 m)   Wt 225 lb (102.1 kg)   SpO2 96%   BMI 33.23 kg/m  Exam: General: NAD, pleasant, wife at bedside  Eyes: PERRL, EOMI, no conjunctival pallor or injection  ENTM: Moist mucous membranes, no pharyngeal erythema or exudate Neck: Supple, no LAD  Cardiovascular: Intact distal pulses.  An irregularly irregular rhythm present. Tachycardia present. Exam reveals no friction rub. 1+ B/L LE edema symmetrically.  Respiratory: Increased work of breathing, diffuse wheezing B/L with crackles noted at the bases, speaking in 5-6 word sentences.  Gastrointestinal: soft, nontender, nondistended, normoactive BS  MSK: moves 4 extremities spontaneously  Derm: no rashes appreciated Neuro: CN II-XII grossly intact Psych: AOx3, appropriate affect   Labs and Imaging: CBC BMET   Recent Labs Lab 04/03/17 1403  WBC 6.1  HGB 14.6  HCT 44.4  PLT 238    Recent Labs Lab 04/03/17 1403  NA 137  K 4.4  CL 108  CO2 20*  BUN 19  CREATININE 1.19  GLUCOSE 110*  CALCIUM 8.9    D-Dimer 1.74  istat Troponin 0.02   [place] Shirley, PGY-1 04/03/2017   I saw and evaluated the above  patient with Dr. Enid Derry and agree with her documentation.  I have included my edits in blue.   Lovenia Kim, MD 04/03/2017, 10:23 PM PGY-2, [facility] pager: [phone], text pages welcome

## 2017-04-03 NOTE — Telephone Encounter (Signed)
Contacted pt to give MD's response. Informed pt that I was calling in regards to his phone call with [facility]. Pts response was "Why are you calling me? I am done with [facility]. I am in the Emergency Room now"   MD has been informed.

## 2017-04-03 NOTE — Telephone Encounter (Signed)
Patient called in and was transferred to [facility] after disclosing he was having shortness of breath.  [facility] triaged patient and advised patient to go to ED with symptoms of confusion, shortness of breath after flying on a plane.  [facility] was concerned of a possible blood clot.  They expressed to me that patient did not want to go to the ED and was insisting on seeing a doctor today.  Our schedules for today were full.  I called patient and expressed to him that I would try to get him seen at our [place] office today and asked for him to hold the line.  [facility] schedulers had to consult with MD's (did state Dr. Sarajane Jews as one of the consulting MD's) to see if they would be able to treat patient today given recent information given to [facility].  Patient had disconnected the line.  I called patient back to inform him that [facility] was consulting with their MD's to see if it was recommended for patient to be seen or go to ED.  I informed patient once I found out what they were able to do I would follow back up with him and that we could get him in to be seen tomorrow for a follow up.  Patient was upset with the response.  He stated that we did not care about him and all he wanted was a BP check.  I apologized to the patient for feeling that way and that I felt we did care about him given the response to serious symptoms he stated he was having.  Patient stated he was done with our office and felt like he needed to find primary care elsewhere.  Patient expressed to me that he was done with our conversation and hung up.  Did speak with [facility] after this conversation and the MD's there concluded that patient did need to seek ED care.   Patient was followed up with by Terence Lux, Dr. Quay Burow assistant.  Please see below.

## 2017-04-03 NOTE — ED Provider Notes (Signed)
[place] [place] DEPT Provider Note   CSN: [id number] Arrival date & time: 04/03/17  1338     History   Chief Complaint Chief Complaint  Patient presents with  . Shortness of Breath  . Weakness    HPI Russell [name] is a 76 y.o. male w PMHx HTN, HLD, diverticulosis, prostate CA, presenting with persistent fatigue that has been gradually worsening x1 week. Patient states that 6-7 weeks ago he began to feel as though he was "out of shape." He states that one week ago he traveled to [place] and from the beginning of the trip he was feeling more fatigued than usual. He states the past couple days he started to feel short of breath on exertion. Denies cardiac history, lower extremity edema, palpitations, chest pain, syncope, hx of DVT/PE, or any other complaints today. Per chart review, he attempted to see his PCP this morning, however was unable to get an appointment and they recommended he report the ER. He does not have a cardiologist.  The history is provided by the patient.    Past Medical History:  Diagnosis Date  . Diverticulosis   . History of prostate cancer    Dr.Peterson  . History of skin cancer    basal cell, Dr.Turner   . Hx of colonic polyp    Dr Sharlett Iles  . Hyperlipidemia    NMR 2011, LDL 103 (1410/120),HDL 73, TG 76. LDL goal =<130  . Hypertension   . Hypertensive encephalopathy    08/01/2009-08/03/2009    Patient Active Problem List   Diagnosis Date Noted  . Wears hearing aid 11/09/2016  . Obesity (BMI 30-39.9) 11/04/2013  . DIVERTICULOSIS, COLON 08/31/2010  . 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 Hyperlipidemia 07/17/2008  . Essential hypertension 07/17/2008  . PROSTATE CANCER, HX OF 07/17/2008  . SKIN CANCER, HX OF 07/17/2008  . History of colonic polyps 07/17/2008    Past Surgical History:  Procedure Laterality Date  . CATARACT EXTRACTION  2010   OD, Dr.Digby  . COLONOSCOPY W/ POLYPECTOMY     2000, Neg [id number] & 2014. Dr.Patterson  .  PROSTATECTOMY  2002   Radiation treatmens 2002-2003  . TONSILLECTOMY         Home Medications    Prior to Admission medications   Medication Sig Start Date End Date Taking? Authorizing Provider  aspirin EC 81 MG tablet Take 81 mg by mouth daily.   Yes [provider]  atorvastatin (LIPITOR) 20 MG tablet TAKE ONE TABLET EACH DAY IN PLACE OF PRAVASTATIN Patient taking differently: TAKE ONE TABLET(20 MG) BY MOUTH EVERY MORNING 11/21/16  Yes Burns, Claudina Lick, MD  losartan (COZAAR) 100 MG tablet TAKE ONE TABLET EACH DAY Patient taking differently: TAKE ONE TABLET (100 MG) BY MOUTH EVERY MORNING 11/21/16  Yes Burns, Claudina Lick, MD  metoprolol (LOPRESSOR) 100 MG tablet TAKE 1/2 TABLET TWICE DAILY Patient taking differently: TAKE 1 TABLET (100 MG) BY MOUTH EVERY MORNING 11/21/16  Yes Burns, Claudina Lick, MD    Family History Family History  Problem Relation Age of Onset  . Atrial fibrillation Father   . Colon cancer Mother 31  . Prostate cancer Brother 70  . Heart attack Maternal Grandfather 68  . Pancreatic cancer Maternal Grandmother   . Stomach cancer Paternal Grandfather   . Diabetes Neg Hx   . Stroke Neg Hx   . Esophageal cancer Neg Hx   . Rectal cancer Neg Hx     Social History Social History  Substance Use Topics  .  Smoking status: Former Smoker    Quit date: 08/15/1978  . Smokeless tobacco: Never Used     Comment: smoked age [age], up to 1ppd  . Alcohol use 6.0 oz/week    10 Glasses of wine per week     Allergies   Patient has no known allergies.   Review of Systems Review of Systems  Constitutional: Positive for fatigue. Negative for chills and fever.  HENT: Negative.   Eyes: Negative.   Respiratory: Positive for shortness of breath.   Cardiovascular: Negative for chest pain, palpitations and leg swelling.  Gastrointestinal: Negative for abdominal pain and nausea.  Genitourinary: Negative for dysuria and frequency.  Musculoskeletal: Negative for back pain.  Skin:  Negative.   Allergic/Immunologic: Negative for immunocompromised state.  Neurological: Positive for weakness.     Physical Exam Updated Vital Signs BP (!) 154/129   Pulse (!) 43   Temp 98.3 F (36.8 C) (Oral)   Resp 16   Ht 5\' 9"  (1.753 m)   Wt 102.1 kg (225 lb)   SpO2 96%   BMI 33.23 kg/m   Physical Exam  Constitutional: He appears well-developed and well-nourished. No distress.  HENT:  Head: Normocephalic and atraumatic.  Mouth/Throat: Oropharynx is clear and moist.  Eyes: Conjunctivae are normal.  Neck: Normal range of motion. Neck supple. No tracheal deviation present.  Cardiovascular: Intact distal pulses.  An irregularly irregular rhythm present. Tachycardia present.  Exam reveals gallop. Exam reveals no friction rub.   No murmur heard. A-fib w RVR  Pulmonary/Chest: Effort normal and breath sounds normal. No stridor. No respiratory distress. He has no wheezes. He has no rales.  Abdominal: Soft. Bowel sounds are normal. He exhibits no distension and no mass. There is no tenderness. There is no guarding. A hernia (umbilical, reducible, nonTTP) is present.  Musculoskeletal: Normal range of motion.  B/l lower extremity symmetric 2+ pitting edema, no erythema or tenderness  Lymphadenopathy:    He has no cervical adenopathy.  Neurological: He is alert.  Skin: Skin is warm. He is not diaphoretic.  Psychiatric: He has a normal mood and affect. His behavior is normal.  Nursing note and vitals reviewed.    ED Treatments / Results  Labs (all labs ordered are listed, but only abnormal results are displayed) Labs Reviewed  BASIC METABOLIC PANEL - Abnormal; Notable for the following:       Result Value   CO2 20 (*)    Glucose, Bld 110 (*)    GFR calc non Af Amer 58 (*)    All other components within normal limits  D-DIMER, QUANTITATIVE (NOT AT [facility]) - Abnormal; Notable for the following:    D-Dimer, Quant 1.74 (*)    All other components within normal limits  CBC    I-STAT TROPONIN, ED    EKG  EKG Interpretation  Date/Time:  Monday April 03 2017 13:50:50 EDT Ventricular Rate:  127 PR Interval:    QRS Duration: 100 QT Interval:  334 QTC Calculation: 485 R Axis:   1 Text Interpretation:  Atrial fibrillation with rapid ventricular response Incomplete right bundle branch block Nonspecific ST and T wave abnormality Abnormal ECG Atrial fibrillation Confirmed by Thomasene Lot, [place] [phone]) on 04/03/2017 2:25:32 PM Also confirmed by Zenovia Jarred [phone])  on 04/03/2017 3:55:37 PM       Radiology Dg Chest 2 View  Result Date: 04/03/2017 CLINICAL DATA:  Shortness of breath. EXAM: CHEST  2 VIEW COMPARISON:  None. FINDINGS: The heart size borderline. The hila and  mediastinum are normal. Small bilateral pleural effusions with underlying atelectasis. No overt edema. No nodules or masses. Atelectasis or scar in the left base. No other acute abnormalities. IMPRESSION: Small bilateral effusions with underlying atelectasis. Electronically Signed   By: Dorise Bullion III M.D   On: 04/03/2017 15:04    Procedures Procedures (including critical care time)  Medications Ordered in ED Medications  diltiazem (CARDIZEM) injection 10 mg (10 mg Intravenous Given 04/03/17 1618)     Initial Impression / Assessment and Plan / ED Course  I have reviewed the triage vital signs and the nursing notes.  Pertinent labs & imaging results that were available during my care of the patient were reviewed by me and considered in my medical decision making (see chart for details).     MDM Pt presenting with worsening fatigue for multiple weeks. On arrival, EKG showing a-fib with RVR in 140s. Trop neg. CBC, BMP unremarkable. CXR w small b/l effusions. Pt given 10mg  IV diltiazem for rate control w some improvement. Chads2vasc 3; pt requiring anticoagulation. No clinical signs of DVT. D-dimer positive; CTA pending to r/o PE. On re-evaluation, pt is not SOB with O2 sat >95%.  Consulted medicine for admission for new onset a-fib w RVR; spoke with Dr. Martinique Shirley, who is accepting admission.   Patient discussed with and seen by Dr. Thomasene Lot, who agrees with care plan.  The patient appears reasonably stabilized for admission considering the current resources, flow, and capabilities available in the ED at this time, and I doubt any other [facility] requiring further screening and/or treatment in the ED prior to admission.   Final Clinical Impressions(s) / ED Diagnoses   Final diagnoses:  New onset atrial fibrillation [facility])    New Prescriptions New Prescriptions   No medications on file     Russo, Martinique N, PA-C 04/03/17 1717    Russo, Martinique N, PA-C 04/03/17 1718    Macarthur Critchley, MD 04/05/17 1758

## 2017-04-03 NOTE — Telephone Encounter (Signed)
Patient Name: Russell [name]  DOB: 1941-02-05    Initial Comment Caller states that he had an episode of confusion this past week seemed to be an isolated incident, he is having weakness   Nurse Assessment  Nurse: Raphael Gibney, RN, Vanita Ingles Date/Time (Eastern Time): 04/03/2017 12:36:02 PM  Confirm and document reason for call. If symptomatic, describe symptoms. ---Caller states he was on vacation last week. Had a period of confusion last week. He was having trouble remembering and disoriented. He is weak. Had SOB when he was on vacation. Better but he is still SOB. No chest pain.  Does the patient have any new or worsening symptoms? ---Yes  Will a triage be completed? ---Yes  Related visit to physician within the last 2 weeks? ---No  Does the PT have any chronic conditions? (i.e. diabetes, asthma, etc.) ---Yes  List chronic conditions. ---HTN  Is this a behavioral health or substance abuse call? ---No     Guidelines    Guideline Title Affirmed Question Affirmed Notes  Confusion - Delirium [1] Acting confused (e.g., disoriented, slurred speech) AND [2] brief (now gone)   Breathing Difficulty Recent long-distance travel with prolonged time in car, bus, plane, or train (i.e., within past 2 weeks; 6 or more hours duration)    Final Disposition User   Go to ED Now Raphael Gibney, RN, Vanita Ingles    Comments  pt states he is not going to the ER but wants to see a doctor.  called office and spoke to [facility] and gave report that pt was in [place] last week. had period of confusion last week where he was disoriented. Was SOB last week. Breathing better but still SOB. Had a long plane ride. Triage outcome of go to ER now but pt states he wants to see a doctor.   Referrals  GO TO FACILITY REFUSED   Disagree/Comply: Comply    Disagree/Comply: Disagree  Disagree/Comply Reason: Disagree with instructions

## 2017-04-03 NOTE — ED Triage Notes (Signed)
Pt states he was in [place] for the last week and has had increased shortness of breath. Pt states he traveled by air plane. Wife also states pt had a change in mental status last week. Pt alert and oriented X4. resp e/u. Tachycardic in triage.

## 2017-04-03 NOTE — ED Notes (Signed)
Admitting Provider at bedside. 

## 2017-04-03 NOTE — ED Notes (Signed)
Patient transported to CT 

## 2017-04-03 NOTE — Telephone Encounter (Signed)
noted 

## 2017-04-03 NOTE — ED Notes (Signed)
Patient transported to X-ray 

## 2017-04-03 NOTE — Telephone Encounter (Signed)
Concerns about a possible blood clot, infection with his current symptoms.  Being evaluated in the ED would be best if he is still having some SOB, which again may indicate a blood clot given his travel.    He should be evaluated - he can consider urgent care, but the ED given his symptoms is the best option.

## 2017-04-03 NOTE — Consult Note (Signed)
Cardiology Consultation:   Patient ID: TRAVORIS BUSHEY; [id number]; 21-Oct-1940   Admit date: 04/03/2017 Date of Consult: 04/03/2017  Primary Care Provider: Binnie Rail, MD Primary Cardiologist: New  Chief Complaint: SOB  Patient Profile:   TERRIL CHESTNUT is a 76 y.o. male with a hx of HTN, reported hypertensive encephalopathy in 2010, obesity, HLD, former tobacco abuse, prostate cancer tx with surgery/radiation, habitual alcohol use, snoring who is being seen today for the evaluation of atrial fib at the request of Dr. Enid Derry. He is friends with Coralie Keens.  History of Present Illness:    Mr. Hasting has no formal cardiac history but reports a prior stress test several years ago that he believes was normal. Since the beginning of summer, Mr. Langston has noticed gradually increasing dyspnea. It was particularly noticeable during a recent trip out [place] to [place]. He has realized he gets winded even with mild activities. He's not noticed any palpitations, chest pain/pressure, syncope. He has no history of bleeding in the past. He reports longstanding chronic LEE, L>R which he feels is no different lately. He got back on an airline flight last evening and was not able to get an appointment with his doctor and so due to persistent dyspnea with worsening over the last few days, he presented to the ER this evening where he was found to be in rapid HR in the 140s with SBP 172/142. He has not been following his BP at home recently. Pulse ox wnl. CTA with no evidence of PE; suspected mild interstitial edema, small bilateral pleural effusions, CAD, aortic atherosclerosis. He received 10mg  IV diltiazem with HR remaining in the 120s-130s, SBP remains markedly elevated and he continues to feel SOB. No orthopnea. Reports drinking 2-3 glasses of wine nightly. + Snoring, no prior dx of OSA.  Past Medical History:  Diagnosis Date  . Diverticulosis   . Habitual alcohol use   . History of prostate  cancer    Dr.Peterson  . History of skin cancer    basal cell, Dr.Turner   . Hx of colonic polyp    Dr Sharlett Iles  . Hyperlipidemia    NMR 2011, LDL 103 (1410/120),HDL 73, TG 76. LDL goal =<130  . Hypertension   . Hypertensive encephalopathy    08/01/2009-08/03/2009    Past Surgical History:  Procedure Laterality Date  . CATARACT EXTRACTION  2010   OD, Dr.Digby  . COLONOSCOPY W/ POLYPECTOMY     2000, Neg [id number] & 2014. Dr.Patterson  . PROSTATECTOMY  2002   Radiation treatmens 2002-2003  . TONSILLECTOMY       Inpatient Medications: Scheduled Meds: . aspirin EC  81 mg Oral Daily  . [START ON 04/04/2017] atorvastatin  20 mg Oral Daily  . [START ON 04/04/2017] losartan  100 mg Oral Daily  . metoprolol tartrate  5 mg Intravenous Once   Continuous Infusions:  PRN Meds:   Allergies:   No Known Allergies  Social History:   Social History   Social History  . Marital status: Married    Spouse name: N/A  . Number of children: N/A  . Years of education: N/A   Occupational History  He is a Engineer, structural and owns apartment complexes   Social History Main Topics  . Smoking status: Former Smoker    Quit date: 08/15/1978  . Smokeless tobacco: Never Used     Comment: smoked age [age], up to 1ppd  . Alcohol use 6.0 oz/week    10 Glasses of  wine per week     Comment: Drinks 2-3 drinks per day  . Drug use: No  . Sexual activity: Not on file   Other Topics Concern  . Not on file   Social History Narrative   Exercise- 4-5 days a week - walking or fitness center    Family History:   The patient's family history includes Atrial fibrillation in his father; Colon cancer (age of onset: 49) in his mother; Heart attack (age of onset: 68) in his maternal grandfather; Pancreatic cancer in his maternal grandmother; Prostate cancer (age of onset: 24) in his brother; Stomach cancer in his paternal grandfather. There is no history of Diabetes, Stroke, Esophageal cancer, or  Rectal cancer.  ROS:  Please see the history of present illness. He has had cataract surgery, he complains of occasional constipation and has some occasional issues with some GI distress.  He has been following a carbohydrate restricted diet and has lost some weight recently.  He has not been checking his blood pressure.   All other ROS reviewed and negative.     Physical Exam/Data:   Vitals:   04/03/17 1630 04/03/17 1700 04/03/17 1800 04/03/17 1830  BP: (!) 156/127 (!) 154/129 (!) 162/124 (!) 186/132  Pulse: 80 (!) 43 (!) 105 (!) 131  Resp: 15 16 (!) 33 (!) 26  Temp:      TempSrc:      SpO2: 97% 96% 97% 97%  Weight:      Height:       No intake or output data in the 24 hours ending 04/03/17 1956 Filed Weights   04/03/17 1351  Weight: 225 lb (102.1 kg)   Body mass index is 33.23 kg/m.  General: Well developed, well nourished obese WM, in no acute distress. Head: Normocephalic, atraumatic, sclera non-icteric, no xanthomas, nares are without discharge.  Neck: Negative for carotid bruits. JVD not elevated. Lungs: Diffusely diminished BS without wheezes, rales, or rhonchi. Breathing is unlabored. Heart:Rapid irregular rhythm, normal S1 and S2, no S3  Abdomen: Soft, non-tender, non-distended with normoactive bowel sounds. No hepatomegaly. No rebound/guarding. No obvious abdominal masses. Msk:  Strength and tone appear normal for age. Extremities: No clubbing or cyanosis. 1+ BLE edema L>R  Distal pedal pulses are 2+ and equal bilaterally. Neuro: Alert and oriented X 3. No facial asymmetry. No focal deficit. Moves all extremities spontaneously. Psych:  Responds to questions appropriately with a normal affect.  EKG:  The EKG was personally reviewed and demonstrates atrial fib RVR 127bpm, nonspecific ST-T changes  Relevant CV Studies: None  Laboratory Data:  Chemistry Recent Labs Lab 04/03/17 1403  NA 137  K 4.4  CL 108  CO2 20*  GLUCOSE 110*  BUN 19  CREATININE 1.19    CALCIUM 8.9  GFRNONAA 58*  GFRAA >60  ANIONGAP 9     Hematology Recent Labs Lab 04/03/17 1403  WBC 6.1  RBC 4.68  HGB 14.6  HCT 44.4  MCV 94.9  MCH 31.2  MCHC 32.9  RDW 14.0  PLT 238    Recent Labs Lab 04/03/17 1408  TROPIPOC 0.02     DDimer  Recent Labs Lab 04/03/17 1611  DDIMER 1.74*    Radiology/Studies:  Dg Chest 2 View  Result Date: 04/03/2017 CLINICAL DATA:  Shortness of breath. EXAM: CHEST  2 VIEW COMPARISON:  None. FINDINGS: The heart size borderline. The hila and mediastinum are normal. Small bilateral pleural effusions with underlying atelectasis. No overt edema. No nodules or masses. Atelectasis or scar  in the left base. No other acute abnormalities. IMPRESSION: Small bilateral effusions with underlying atelectasis. Electronically Signed   By: Dorise Bullion III M.D   On: 04/03/2017 15:04   Ct Angio Chest Pe W/cm &/or Wo Cm  Result Date: 04/03/2017 CLINICAL DATA:  Shortness of breath, fatigue EXAM: CT ANGIOGRAPHY CHEST WITH CONTRAST TECHNIQUE: Multidetector CT imaging of the chest was performed using the standard protocol during bolus administration of intravenous contrast. Multiplanar CT image reconstructions and MIPs were obtained to evaluate the vascular anatomy. CONTRAST:  100 mL Isovue 370 IV COMPARISON:  Chest radiographs dated 04/03/2017 FINDINGS: Cardiovascular: Poor opacification of the bilateral lower lobe pulmonary arteries due to bolus timing. Otherwise, satisfactory opacification of the bilateral pulmonary arteries to the lobar level. No evidence of pulmonary embolism. No evidence of thoracic aortic aneurysm. Atherosclerotic calcifications of the aortic root/valve and aortic arch. The heart is top-normal in size.  No pericardial effusion. 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 Mediastinum/Nodes: No suspicious mediastinal lymphadenopathy. Visualized thyroid is unremarkable. Lungs/Pleura: Evaluation of the lung parenchyma is  constrained by respiratory motion. Mild ground-glass opacities in the lungs bilaterally, suggesting mild interstitial edema. Associated small bilateral pleural effusions. No focal consolidation. No pleural effusion or pneumothorax. Upper Abdomen: Visualized upper abdomen is grossly unremarkable. Musculoskeletal: Degenerative changes of the visualized thoracolumbar spine. Review of the MIP images confirms the above findings. IMPRESSION: No evidence of pulmonary embolism. Suspected mild interstitial edema. Small bilateral pleural effusions. Aortic Atherosclerosis (ICD10-I70.0). Electronically Signed   By: Julian Hy M.D.   On: 04/03/2017 18:09    Assessment and Plan:   1. New onset atrial fibrillation 2. Suspected acute CHF, LVEF unknown 3. Accelerated HTN 4. Hyperglycemia  5. Obesity Body mass index is 33.23 kg/m.  6. Snoring 7. Habitual alcohol use  Signed, Charlie Pitter, PA-C  04/03/2017 7:56 PM    Patient seen in the emergency room complete history and examination done.  Old chart reviewed.  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  He appears to have had chronic edema and has had chronic shortness of breath for some time and just got back from a trip out [place].  He presented with atrial fibrillation.  His rate is come under somewhat better control but is still significantly hypertensive.  He does not check his blood pressures at home.  He drinks at least 2 glasses of 1 per day.  At this point in time he needs to have good blood pressure control and will use labetalol to help achieve this.  He also should be diuresed and will obtain a BNP level.  He will be started on systemic anticoagulation and we will try to get rate control and then look at the echocardiogram to see what his LV function looks like.  At some point he'll need to have a sleep study.  Once he has been adequately anticoagulated and will decide about the timing of cardioversion to get him back into sinus  rhythm.  1.  Persistent atrial fibrillation CHA2DS2VASC score 4 2.  Hypertensive heart disease currently poorly controlled 3.  Obesity 4.  Probable congestive heart failure suspect diastolic 5.  Hyperlipidemia 6.  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  Aortic atherosclerosis by CT scan 8.  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 Doristine Church MD [facility] 8:35 PM

## 2017-04-03 NOTE — Telephone Encounter (Signed)
Pt is refusing to go to ED, [facility] will not see pt due to [facility] suggesting that pt goes to ED. Please advise.

## 2017-04-03 NOTE — Progress Notes (Signed)
D/w Dr. Wynonia Lawman - remains hypertensive and tachycardic - per MD will rx 20mg  IV Labetalol, 20mg  IV Lasix, start IV diliazem drip and rx Eliquis. Ordered Eliquis per pharmacy so he receives education as well as information on his dc paperwork as well regarding anticoag. Added TSH, A1C to labs. D/c'd order for lovenox per pharm that was on admission orders. Dayna Dunn PA-C

## 2017-04-03 NOTE — ED Notes (Signed)
ED Provider at bedside. 

## 2017-04-04 ENCOUNTER — Ambulatory Visit (HOSPITAL_COMMUNITY): Payer: Medicare Other

## 2017-04-04 DIAGNOSIS — E669 Obesity, unspecified: Secondary | ICD-10-CM | POA: Diagnosis not present

## 2017-04-04 DIAGNOSIS — I481 Persistent atrial fibrillation: Secondary | ICD-10-CM

## 2017-04-04 DIAGNOSIS — E7800 Pure hypercholesterolemia, unspecified: Secondary | ICD-10-CM

## 2017-04-04 DIAGNOSIS — I110 Hypertensive heart disease with heart failure: Secondary | ICD-10-CM | POA: Diagnosis not present

## 2017-04-04 DIAGNOSIS — I4891 Unspecified atrial fibrillation: Secondary | ICD-10-CM | POA: Diagnosis present

## 2017-04-04 DIAGNOSIS — I2510 Atherosclerotic heart disease of native coronary artery without angina pectoris: Secondary | ICD-10-CM | POA: Diagnosis not present

## 2017-04-04 DIAGNOSIS — R0683 Snoring: Secondary | ICD-10-CM | POA: Diagnosis present

## 2017-04-04 DIAGNOSIS — I501 Left ventricular failure: Secondary | ICD-10-CM

## 2017-04-04 DIAGNOSIS — I700 Atherosclerosis of aorta: Secondary | ICD-10-CM

## 2017-04-04 DIAGNOSIS — I5031 Acute diastolic (congestive) heart failure: Secondary | ICD-10-CM | POA: Diagnosis not present

## 2017-04-04 LAB — BASIC METABOLIC PANEL
Anion gap: 9 (ref 5–15)
BUN: 15 mg/dL (ref 6–20)
CHLORIDE: 106 mmol/L (ref 101–111)
CO2: 25 mmol/L (ref 22–32)
CREATININE: 1.1 mg/dL (ref 0.61–1.24)
Calcium: 8.7 mg/dL — ABNORMAL LOW (ref 8.9–10.3)
GFR calc non Af Amer: >60 mL/min (ref >60)
Glucose, Bld: 95 mg/dL (ref 65–99)
POTASSIUM: 3.7 mmol/L (ref 3.5–5.1)
SODIUM: 140 mmol/L (ref 135–145)

## 2017-04-04 LAB — HEPATIC FUNCTION PANEL
ALK PHOS: 86 U/L (ref 38–126)
ALT: 42 U/L (ref 17–63)
AST: 30 U/L (ref 15–41)
Albumin: 3.8 g/dL (ref 3.5–5.0)
BILIRUBIN INDIRECT: 0.8 mg/dL (ref 0.3–0.9)
Bilirubin, Direct: 0.2 mg/dL (ref 0.1–0.5)
TOTAL PROTEIN: 6.3 g/dL — AB (ref 6.5–8.1)
Total Bilirubin: 1 mg/dL (ref 0.3–1.2)

## 2017-04-04 LAB — CBC
HEMATOCRIT: 42.4 % (ref 39.0–52.0)
Hemoglobin: 14.2 g/dL (ref 13.0–17.0)
MCH: 31.1 pg (ref 26.0–34.0)
MCHC: 33.5 g/dL (ref 30.0–36.0)
MCV: 93 fL (ref 78.0–100.0)
PLATELETS: 210 10*3/uL (ref 150–400)
RBC: 4.56 MIL/uL (ref 4.22–5.81)
RDW: 13.5 % (ref 11.5–15.5)
WBC: 6 10*3/uL (ref 4.0–10.5)

## 2017-04-04 LAB — HEMOGLOBIN A1C
HEMOGLOBIN A1C: 5.3 % (ref 4.8–5.6)
MEAN PLASMA GLUCOSE: 105.41 mg/dL

## 2017-04-04 LAB — ECHOCARDIOGRAM COMPLETE
Height: 69 in
Weight: 3560 oz

## 2017-04-04 LAB — PROTIME-INR
INR: 1.17
PROTHROMBIN TIME: 15 s (ref 11.4–15.2)

## 2017-04-04 LAB — BRAIN NATRIURETIC PEPTIDE: B NATRIURETIC PEPTIDE 5: 360.3 pg/mL — AB (ref 0.0–100.0)

## 2017-04-04 LAB — TSH: TSH: 3.684 u[IU]/mL (ref 0.350–4.500)

## 2017-04-04 MED ORDER — POLYETHYLENE GLYCOL 3350 17 G PO PACK: 17.0000 g | PACK | Freq: Every day | ORAL | Status: DC | PRN

## 2017-04-04 MED ORDER — FUROSEMIDE 20 MG PO TABS
20.0000 mg | ORAL_TABLET | Freq: Every day | ORAL | Status: DC
  Administered 2017-04-04 – 2017-04-05 (×2): 20 mg via ORAL
  Filled 2017-04-04 (×2): qty 1

## 2017-04-04 MED ORDER — ACETAMINOPHEN 325 MG PO TABS: 650.0000 mg | ORAL_TABLET | Freq: Four times a day (QID) | ORAL | Status: DC | PRN

## 2017-04-04 MED ORDER — CALCIUM CARBONATE-VITAMIN D 500-200 MG-UNIT PO TABS
1.0000 | ORAL_TABLET | Freq: Two times a day (BID) | ORAL | Status: AC
  Administered 2017-04-04 (×2): 1 via ORAL
  Filled 2017-04-04 (×2): qty 1

## 2017-04-04 MED ORDER — PROMETHAZINE HCL 25 MG PO TABS: 12.5000 mg | ORAL_TABLET | Freq: Four times a day (QID) | ORAL | Status: DC | PRN

## 2017-04-04 MED ORDER — METOPROLOL TARTRATE 25 MG PO TABS
25.0000 mg | ORAL_TABLET | Freq: Two times a day (BID) | ORAL | Status: DC
  Administered 2017-04-04 – 2017-04-05 (×3): 25 mg via ORAL
  Filled 2017-04-04 (×3): qty 1

## 2017-04-04 MED ORDER — DILTIAZEM HCL 60 MG PO TABS
90.0000 mg | ORAL_TABLET | Freq: Three times a day (TID) | ORAL | Status: DC
  Administered 2017-04-04 – 2017-04-05 (×4): 90 mg via ORAL
  Filled 2017-04-04 (×4): qty 1

## 2017-04-04 MED ORDER — ACETAMINOPHEN 650 MG RE SUPP: 650.0000 mg | Freq: Four times a day (QID) | RECTAL | Status: DC | PRN

## 2017-04-04 NOTE — Progress Notes (Signed)
Family Medicine Teaching Service Daily Progress Note Intern Pager: [phone]  Patient name: Russell [name] Medical record number: [id number] Date of birth: [birth year] Age: 76 y.o. Gender: male  Primary Care Provider: Binnie Rail, MD Consultants: cardio Code Status: full  Pt Overview and Major Events to Date:  Russell [name] is a 76 y.o. male presenting with SOB with new onset A-fib and RVR . PMH is significant for HTN, HLD, h/o prostate cancer.   He was placed on a   Assessment and Plan: Russell [name] is a 76 y.o. male presenting with SOB with new onset A-fib and RVR . PMH is significant for HTN, HLD, h/o prostate cancer.   ??? For cardiology: Home plan for metoprolol with patient having compliance issues for more than daily dosing, he was apparently not taking his home dosing as prescribed? Home diltiazem plan given his concerns about reliably taking multiple doses per day? Is echo outpatient TEE? We would like walking test for rvr while still in hospital if there are no concerns with that    New onset A-Fib with RVR: Patient reporting worsening of SOB for a few weeks. He says his chest has just not felt right.  He has had worsening dyspnea. Does not see a cardiologist. On admission heart rate was in the 140's, RR of 26 and blood pressures elevated to 546'E systolic. EKG showing atrial fibrillation with RVR.    Pt given 10mg  of diltiazem in ED and his rate went into the 80's, still irregularly irregular.  He denies any chest pain or pressure but endorses palpitations.    - Admit to telemetry, attending Dr. McDiarmid  -cardiology will follow, will determine cardioversion timing based on anticoagulation status - continuous cardiac monitoring  - Continue home aspirin  - diltiazem TID PO currently per cards - AM EKG  - AM CBC(normal), BMP (hypocalcemia 8.7) - follow up BNP (360), TSH (normal), PT/ INR (normal) - Obtain ECHO -Eliquis 5 mg BID , per cards  -Pt to receive  eliquis education , per pharmacy  - Vitals per follow protocol  CHF(potential new diagnosis): BNP 360 -Echo ordered -had 2+ pitting edema on admission, no pitting edema on morning rounds 8/21  Dyspnea: New onset four weeks ago, now worsening. Patient has had to take it easy, usually very active, was on a vacation to [place] and had to stop hiking. Pt having trouble catching his breath while giving [place] his history. CXR showed small bilateral effusions with underlying atelectasis. Could consider DVT/PE with recent travel in flights however CTA was neg for PE, suspected mild interstitial edema with small bilateral pleural effusions. 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 Suspect acute CHF, EF unknown vs dyspnea from new onset A-fib. Has not followed with cardiology in the past but will consult.  Due to history of hiking in [place], will evaluate HAPE as a possible contributing factor - Daily weights and Strict I/Os  -ECHO ordered - Monitor oxygen, if <92% on RA, supplement with [place] - OSA work up as outpatient given body habitus  - Consider adding Lasix   Claims of assymetric swelling in legs (left leg larger)- states it comes and goes and has for an undetermined amount of time.   Could be CHF related although assymetric would be uncommon.   Vascular insufficiency seems less likely due to lask of paresthesia/motor control differences but might explain assymetric nature of complaint -did not feel it was currently happening -doppler US?  Hypocalcemia- 8.7 -oscal (x2) with meals today  HLD: stable. - Continue home Lipitor   HTN: Uncontrolled, pt hypertensive on admission.  150s/110s overnight.  At home on Cozaar 100 mg and Lopressor 100 mg daily.  - Continue home Cozaar  -Holding Lopressor, per cards  -metoprolol per cards  History of prostate cancer: Resolved. 8 years ago.   FEN/GI: Heart healthy diet Prophylaxis: Eliquis   Disposition: home pending  stabilization, needs pcp followup  Subjective:  He was feeling well although tired from not getting much sleep.   He was pleasant and curious about his diagnosis so far.  We discussed afib and chf and he wanted to know how long he would need to be here but said he would do what the doctors felt was safe.   No chest pain, felt he was breathing fine, did not feel he had swelling in his legs currently  Objective: Temp:  [98.3 F (36.8 C)] 98.3 F (36.8 C) (08/20 1351) Pulse Rate:  [25-144] 25 (08/21 0100) Resp:  [12-33] 21 (08/21 0100) BP: (141-199)/(112-157) 158/112 (08/21 0100) SpO2:  [92 %-98 %] 92 % (08/21 0100) Weight:  [225 lb (102.1 kg)] 225 lb (102.1 kg) (08/20 1351) Physical Exam: General: NAD, pleasant,  Eyes:  EOMI, no conjunctival pallor or injection  Neck: Supple, no LAD  Cardiovascular: Intact distal pulses. An irregularly irregular rhythmpresent. Rate app. 70. Exam reveals no friction rub.  No notable pitting edema.  Did not feel the legs were assymetric Respiratory:some mild crackles in lower lungs, no IWB noted, no wheezes  Gastrointestinal: soft, nontender, nondistended, normoactive BS  MSK: moves 4 extremities spontaneously  Derm: no rashes appreciated Psych: AOx3, appropriate affect   Laboratory:  Recent Labs Lab 04/03/17 1403  WBC 6.1  HGB 14.6  HCT 44.4  PLT 238    Recent Labs Lab 04/03/17 1403  NA 137  K 4.4  CL 108  CO2 20*  BUN 19  CREATININE 1.19  CALCIUM 8.9  GLUCOSE 110*    BNP 360  Imaging/Diagnostic Tests: Dg Chest 2 View  Result Date: 04/03/2017 CLINICAL DATA:  Shortness of breath. EXAM: CHEST  2 VIEW COMPARISON:  None. FINDINGS: The heart size borderline. The hila and mediastinum are normal. Small bilateral pleural effusions with underlying atelectasis. No overt edema. No nodules or masses. Atelectasis or scar in the left base. No other acute abnormalities. IMPRESSION: Small bilateral effusions with underlying atelectasis.  Electronically Signed   By: Dorise Bullion III M.D   On: 04/03/2017 15:04   Ct Angio Chest Pe W/cm &/or Wo Cm  Result Date: 04/03/2017 CLINICAL DATA:  Shortness of breath, fatigue EXAM: CT ANGIOGRAPHY CHEST WITH CONTRAST TECHNIQUE: Multidetector CT imaging of the chest was performed using the standard protocol during bolus administration of intravenous contrast. Multiplanar CT image reconstructions and MIPs were obtained to evaluate the vascular anatomy. CONTRAST:  100 mL Isovue 370 IV COMPARISON:  Chest radiographs dated 04/03/2017 FINDINGS: Cardiovascular: Poor opacification of the bilateral lower lobe pulmonary arteries due to bolus timing. Otherwise, satisfactory opacification of the bilateral pulmonary arteries to the lobar level. No evidence of pulmonary embolism. No evidence of thoracic aortic aneurysm. Atherosclerotic calcifications of the aortic root/valve and aortic arch. The heart is top-normal in size.  No pericardial effusion. 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 Mediastinum/Nodes: No suspicious mediastinal lymphadenopathy. Visualized thyroid is unremarkable. Lungs/Pleura: Evaluation of the lung parenchyma is constrained by respiratory motion. Mild ground-glass opacities in the lungs bilaterally, suggesting mild interstitial edema. Associated  small bilateral pleural effusions. No focal consolidation. No pleural effusion or pneumothorax. Upper Abdomen: Visualized upper abdomen is grossly unremarkable. Musculoskeletal: Degenerative changes of the visualized thoracolumbar spine. Review of the MIP images confirms the above findings. IMPRESSION: No evidence of pulmonary embolism. Suspected mild interstitial edema. Small bilateral pleural effusions. Aortic Atherosclerosis (ICD10-I70.0). Electronically Signed   By: Julian Hy M.D.   On: 04/03/2017 18:09     Sherene Sires, DO 04/04/2017, 1:59 AM PGY-1, [place] Intern pager: [phone], text pages  welcome

## 2017-04-04 NOTE — Progress Notes (Signed)
Subjective:  No complaints of shortness of breath or chest pain.  Blood pressure came down over the evening.  Objective:  Vital Signs in the last 24 hours: BP (!) 150/101 (BP Location: Left Arm)   Pulse 74   Temp 97.8 F (36.6 C) (Oral)   Resp (!) 22   Ht 5\' 9"  (1.753 m)   Wt 100.9 kg (222 lb 8 oz)   SpO2 92%   BMI 32.86 kg/m   Physical Exam: Pleasant male in no acute distress Lungs:  Faint wheezing Cardiac:  Irregular rhythm, normal S1 and S2, no S3 Abdomen:  Soft, nontender, no masses Extremities:  1+ edema present  Intake/Output from previous day: 08/20 0701 - 08/21 0700 In: 57.5 [I.V.:57.5] Out: 650 [Urine:650]  Weight Filed Weights   04/03/17 1351 04/04/17 0319 04/04/17 0427  Weight: 102.1 kg (225 lb) 102.6 kg (226 lb 4.8 oz) 100.9 kg (222 lb 8 oz)    Lab Results: Basic Metabolic Panel:  Recent Labs  04/03/17 1403 04/04/17 0335  NA 137 140  K 4.4 3.7  CL 108 106  CO2 20* 25  GLUCOSE 110* 95  BUN 19 15  CREATININE 1.19 1.10   CBC:  Recent Labs  04/03/17 1403 04/04/17 0335  WBC 6.1 6.0  HGB 14.6 14.2  HCT 44.4 42.4  MCV 94.9 93.0  PLT 238 210   Cardiac Enzymes: Troponin (Point of Care Test)  Recent Labs  04/03/17 1408  TROPIPOC 0.02   BNP (last 3 results)  Recent Labs  04/04/17 0335  BNP 360.3*    Telemetry: Personally reviewed.  Atrial fibrillation currently with controlled ventricular response   Assessment/Plan:  1.  Persistent atrial fibrillation rate response is better controlled on intravenous diltiazem 2.  Hypertensive heart disease better controlled but still not controlled 3.  Congestive heart failure probably diastolic but awaiting echo to determine type 4.  Long-term use of anticoagulation 5.  Obesity  Recommendations:  Change to oral diltiazem and titrate medicines to control blood pressure.  Continue Eliquis.  If he remains stable he may be able to be discharged in the morning to continue workup as an  outpatient     W. Doristine Church  MD [facility] Card[facility]  04/04/2017, 8:42 AM

## 2017-04-04 NOTE — Progress Notes (Signed)
  Echocardiogram 2D Echocardiogram has been performed.  Tresa Res 04/04/2017, 3:26 PM

## 2017-04-04 NOTE — ED Notes (Signed)
Placed pt on a hospital bed for his comfort.  No complaints at this time

## 2017-04-04 NOTE — Care Management Obs Status (Signed)
[facility] NOTIFICATION   Patient Details  Name: Russell [name] MRN: [id number] Date of Birth: 1941-04-22   Medicare Observation Status Notification Given:  Yes    Dawayne Patricia, RN 04/04/2017, 3:49 PM

## 2017-04-04 NOTE — ED Notes (Signed)
Called admitting provider to request stepdown bed

## 2017-04-04 NOTE — Care Management CC44 (Signed)
Condition Code 44 Documentation Completed  Patient Details  Name: Russell [name] MRN: [id number] Date of Birth: Aug 13, 1941   Condition Code 44 given:  Yes Patient signature on Condition Code 44 notice:  Yes Documentation of 2 MD's agreement:  Yes Code 44 added to claim:  Yes    Dawayne Patricia, RN 04/04/2017, 3:49 PM

## 2017-04-04 NOTE — Discharge Summary (Signed)
[facility] Discharge Summary  Patient name: Russell [name] Medical record number: [id number] Date of birth: April 30, 1941 Age: 76 y.o. Gender: male Date of Admission: 04/03/2017  Date of Discharge: 04/05/17  Admitting Physician: Lovenia Kim, MD  Primary Care Provider: Binnie Rail, MD Consultants: cardiology  Indication for Hospitalization: Afib w/ rvr  Discharge Diagnoses/Problem List:  Afib w/ rvr (now rate controlled on PO diltiazem) HTN HLD H/o prostate cancer  Disposition: d/c to home  Discharge Condition: stable  Discharge Exam: General: NAD, pleasant,  Eyes: EOMI, no conjunctival pallor or injection  Neck: Supple, no LAD  Cardiovascular:An irregularly irregular rhythmpresent. Afib Rate controlled to app. 70. Exam reveals no friction rub. No notable pitting edema.   Respiratory:some mild crackles in lower lungs, no IWB noted, no wheezes Gastrointestinal: soft, nontender, nondistended, normoactive BS  MSK: moves 4 extremities spontaneously  Derm: no rashes appreciated Psych: AOx3, appropriate affect   Brief Hospital Course:  Russell [name] a 76 y.o.malepresenting with SOB with new onset A-fib andRVR. PMH is significant for HTN, HLD, h/o prostate cancer.  He was placed on a diltiazem drip (later transitioned to PO), eliquis for anticoagulation and scheduled for outpatient followup with potential cardioversion in a few weeks.  ECHO showed EF of 50-55% with severe dilatation of both atria.  Issues for Follow Up:  1. Cardiology followup for Afib w/ rvr is necessary, please help coordinate cardiology treatment.  Patient has stated he is to follow outpatient for potential cardioversion 2. These was suspicion of CHF and echo showed EF 50-55% and dilated atria. 3. There is concern of OSA and we recommend a sleep study be considered 4. Patient is on PO diltiazem and new anticoagulant for afib, please discuss adherence to these  medications.   Significant Procedures: echo  Significant Labs and Imaging:   Recent Labs Lab 04/03/17 1403 04/04/17 0335 04/05/17 0310  WBC 6.1 6.0 6.0  HGB 14.6 14.2 14.5  HCT 44.4 42.4 43.2  PLT 238 210 210    Recent Labs Lab 04/03/17 1403 04/04/17 0335 04/05/17 0310  NA 137 140 140  K 4.4 3.7 3.8  CL 108 106 106  CO2 20* 25 28  GLUCOSE 110* 95 96  BUN 19 15 11   CREATININE 1.19 1.10 1.03  CALCIUM 8.9 8.7* 9.1  ALKPHOS  --  86  --   AST  --  30  --   ALT  --  42  --   ALBUMIN  --  3.8  --     Dg Chest 2 View  Result Date: 04/03/2017 CLINICAL DATA:  Shortness of breath. EXAM: CHEST  2 VIEW COMPARISON:  None. FINDINGS: The heart size borderline. The hila and mediastinum are normal. Small bilateral pleural effusions with underlying atelectasis. No overt edema. No nodules or masses. Atelectasis or scar in the left base. No other acute abnormalities. IMPRESSION: Small bilateral effusions with underlying atelectasis. Electronically Signed   By: Dorise Bullion III M.D   On: 04/03/2017 15:04   Ct Angio Chest Pe W/cm &/or Wo Cm  Result Date: 04/03/2017 CLINICAL DATA:  Shortness of breath, fatigue EXAM: CT ANGIOGRAPHY CHEST WITH CONTRAST TECHNIQUE: Multidetector CT imaging of the chest was performed using the standard protocol during bolus administration of intravenous contrast. Multiplanar CT image reconstructions and MIPs were obtained to evaluate the vascular anatomy. CONTRAST:  100 mL Isovue 370 IV COMPARISON:  Chest radiographs dated 04/03/2017 FINDINGS: Cardiovascular: Poor opacification of the bilateral lower lobe pulmonary arteries due  to bolus timing. Otherwise, satisfactory opacification of the bilateral pulmonary arteries to the lobar level. No evidence of pulmonary embolism. No evidence of thoracic aortic aneurysm. Atherosclerotic calcifications of the aortic root/valve and aortic arch. The heart is top-normal in size.  No pericardial effusion. Coronary atherosclerosis  of the LAD and left circumflex. Mediastinum/Nodes: No suspicious mediastinal lymphadenopathy. Visualized thyroid is unremarkable. Lungs/Pleura: Evaluation of the lung parenchyma is constrained by respiratory motion. Mild ground-glass opacities in the lungs bilaterally, suggesting mild interstitial edema. Associated small bilateral pleural effusions. No focal consolidation. No pleural effusion or pneumothorax. Upper Abdomen: Visualized upper abdomen is grossly unremarkable. Musculoskeletal: Degenerative changes of the visualized thoracolumbar spine. Review of the MIP images confirms the above findings. IMPRESSION: No evidence of pulmonary embolism. Suspected mild interstitial edema. Small bilateral pleural effusions. Aortic Atherosclerosis (ICD10-I70.0). Electronically Signed   By: Julian Hy M.D.   On: 04/03/2017 18:09    Results/Tests Pending at Time of Discharge: N/a  Discharge Medications:  Allergies as of 04/05/2017   No Known Allergies     Medication List    TAKE these medications   apixaban 5 MG Tabs tablet Commonly known as:  ELIQUIS Take 1 tablet (5 mg total) by mouth 2 (two) times daily.   aspirin EC 81 MG tablet Take 81 mg by mouth daily.   atorvastatin 20 MG tablet Commonly known as:  LIPITOR TAKE ONE TABLET EACH DAY IN PLACE OF PRAVASTATIN What changed:  See the new instructions.   diltiazem 240 MG 24 hr capsule Commonly known as:  CARDIZEM CD Take 1 capsule (240 mg total) by mouth daily.   furosemide 20 MG tablet Commonly known as:  LASIX Take 1 tablet (20 mg total) by mouth daily.   losartan 100 MG tablet Commonly known as:  COZAAR TAKE ONE TABLET EACH DAY What changed:  See the new instructions.   metoprolol tartrate 100 MG tablet Commonly known as:  LOPRESSOR TAKE 1/2 TABLET TWICE DAILY What changed:  See the new instructions.   spironolactone 25 MG tablet Commonly known as:  ALDACTONE Take 1 tablet (25 mg total) by mouth daily.             Discharge Care Instructions        Start     Ordered   04/06/17 0000  diltiazem (CARDIZEM CD) 240 MG 24 hr capsule  Daily     04/05/17 1103   04/06/17 0000  furosemide (LASIX) 20 MG tablet  Daily     04/05/17 1103   04/06/17 0000  spironolactone (ALDACTONE) 25 MG tablet  Daily     04/05/17 1103   04/05/17 0000  apixaban (ELIQUIS) 5 MG TABS tablet  2 times daily     04/05/17 1103   04/05/17 0000  Increase activity slowly     04/05/17 1103   04/05/17 0000  Diet - low sodium heart healthy     04/05/17 1103      Discharge Instructions: Please refer to Patient Instructions section of EMR for full details.  Patient was counseled important signs and symptoms that should prompt return to medical care, changes in medications, dietary instructions, activity restrictions, and follow up appointments.   Follow-Up Appointments: Follow-up Information    Jacolyn Reedy, MD. Call.   Specialty:  Cardiology Why:  : Make an appointment to see Dr. Wynonia Lawman in one week.  Call if there are any further problems. Contact information: [address] [place] [address] [phone]  [place]. Go on 04/10/2017.   Specialty:  Family Medicine Why:  9:00 w/ Dr. Monika Salk information: [address] [id number] [place] [address] [phone]          Sherene Sires, DO 04/05/2017, 2:46 PM PGY-1, [place]

## 2017-04-04 NOTE — Progress Notes (Signed)
Per insurance check on Eliquis # 2. S/W ANGEL @ [facility] # [id number]    1. ELIQUIS 5 MG BID   COVER- YES  CO-PAY- $ 45.00  TIER- 3 DRUG  PRIOR APPROVAL- NO  DEDUCTIBLE NO    2. ELIQUIS 2.5 MG BID  SAME AS ABOVE   PHARMACY : [facility]

## 2017-04-04 NOTE — Care Management Note (Signed)
Case Management Note Marvetta Gibbons RN, BSN Unit 4E-Case Manager [phone]  Patient Details  Name: ZOHAR MARONEY MRN: [id number] Date of Birth: 1940/12/18  Subjective/Objective:     Pt admitted with new afib- started on Eliquis               Action/Plan: PTA pt lived at home- independent- per insurance check -Eliqus $45/mo- spoke with pt at bedside- discussed copay cost- pt states he can afford- 30 day free card given to pt to use on discharge- no further Cm needs noted.   Expected Discharge Date:                  Expected Discharge Plan:  Home/Self Care  In-House Referral:     Discharge planning Services  CM Consult  Post Acute Care Choice:    Choice offered to:     DME Arranged:    DME Agency:     HH Arranged:    HH Agency:     Status of Service:  In process, will continue to follow  If discussed at Long Length of Stay Meetings, dates discussed:    Discharge Disposition:   Additional Comments:  Dawayne Patricia, RN 04/04/2017, 3:55 PM

## 2017-04-04 NOTE — Discharge Instructions (Addendum)
You were admitted to the hospital with a new abnormal heart rhythm known as a-fib. We have given you medications to control your heart rate while you were here. Please see your above medications that you will need to take at home. Additionally you were started on a blood thinner, Eliquis.   Please follow up with the cardiologist and at the family medicine clinic.   It was a pleasure caring for you, take care!    Atrial Fibrillation Atrial fibrillation is a type of heartbeat that is irregular or fast (rapid). If you have this condition, your heart keeps quivering in a weird (chaotic) way. This condition can make it so your heart cannot pump blood normally. Having this condition gives a person more risk for stroke, heart failure, and other heart problems. There are different types of atrial fibrillation. Talk with your doctor to learn about the type that you have. Follow these instructions at home:  Take over-the-counter and prescription medicines only as told by your doctor.  If your doctor prescribed a blood-thinning medicine, take it exactly as told. Taking too much of it can cause bleeding. If you do not take enough of it, you will not have the protection that you need against stroke and other problems.  Do not use any tobacco products. These include cigarettes, chewing tobacco, and e-cigarettes. If you need help quitting, ask your doctor.  If you have apnea (obstructive sleep apnea), manage it as told by your doctor.  Do not drink alcohol.  Do not drink beverages that have caffeine. These include coffee, soda, and tea.  Maintain a healthy weight. Do not use diet pills unless your doctor says they are safe for you. Diet pills may make heart problems worse.  Follow diet instructions as told by your doctor.  Exercise regularly as told by your doctor.  Keep all follow-up visits as told by your doctor. This is important. Contact a doctor if:  You notice a change in the speed, rhythm,  or strength of your heartbeat.  You are taking a blood-thinning medicine and you notice more bruising.  You get tired more easily when you move or exercise. Get help right away if:  You have pain in your chest or your belly (abdomen).  You have sweating or weakness.  You feel sick to your stomach (nauseous).  You notice blood in your throw up (vomit), poop (stool), or pee (urine).  You are short of breath.  You suddenly have swollen feet and ankles.  You feel dizzy.  Your suddenly get weak or numb in your face, arms, or legs, especially if it happens on one side of your body.  You have trouble talking, trouble understanding, or both.  Your face or your eyelid droops on one side. These symptoms may be an emergency. Do not wait to see if the symptoms will go away. Get medical help right away. Call your local emergency services (911 in the [place] Do not drive yourself to the hospital. This information is not intended to replace advice given to you by your health care provider. Make sure you discuss any questions you have with your health care provider. Document Released: 05/10/2008 Document Revised: 01/07/2016 Document Reviewed: 11/26/2014 Elsevier Interactive Patient Education  2018 [place] on my medicine - ELIQUIS (apixaban)  This medication education was reviewed with me or my healthcare representative as part of my discharge preparation.  The pharmacist that spoke with me during my hospital stay was:  Wayland Salinas,  [place]  Why was Eliquis prescribed for you? Eliquis was prescribed for you to reduce the risk of a blood clot forming that can cause a stroke if you have a medical condition called atrial fibrillation (a type of irregular heartbeat).  What do You need to know about Eliquis ? Take your Eliquis TWICE DAILY - one tablet in the morning and one tablet in the evening with or without food. If you have difficulty swallowing the  tablet whole please discuss with your pharmacist how to take the medication safely.  Take Eliquis exactly as prescribed by your doctor and DO NOT stop taking Eliquis without talking to the doctor who prescribed the medication.  Stopping may increase your risk of developing a stroke.  Refill your prescription before you run out.  After discharge, you should have regular check-up appointments with your healthcare provider that is prescribing your Eliquis.  In the future your dose may need to be changed if your kidney function or weight changes by a significant amount or as you get older.  What do you do if you miss a dose? If you miss a dose, take it as soon as you remember on the same day and resume taking twice daily.  Do not take more than one dose of ELIQUIS at the same time to make up a missed dose.  Important Safety Information A possible side effect of Eliquis is bleeding. You should call your healthcare provider right away if you experience any of the following: ? Bleeding from an injury or your nose that does not stop. ? Unusual colored urine (red or dark brown) or unusual colored stools (red or black). ? Unusual bruising for unknown reasons. ? A serious fall or if you hit your head (even if there is no bleeding).  Some medicines may interact with Eliquis and might increase your risk of bleeding or clotting while on Eliquis. To help avoid this, consult your healthcare provider or pharmacist prior to using any new prescription or non-prescription medications, including herbals, vitamins, non-steroidal anti-inflammatory drugs (NSAIDs) and supplements.  This website has more information on Eliquis (apixaban): http://www.eliquis.com/eliquis/home

## 2017-04-04 NOTE — Plan of Care (Signed)
Problem: Skin Integrity: Goal: Risk for impaired skin integrity will decrease Outcome: Progressing Skin CDI

## 2017-04-04 NOTE — Evaluation (Signed)
Physical Therapy Evaluation Patient Details Name: Russell [name] MRN: [id number] DOB: 09-01-1940 Today's Date: 04/04/2017   History of Present Illness   Pt is a 76 yo male admitted with c/o SOB dx new onset A-fib and RVR . PMH is significant for HTN, HLD, h/o prostate cancer.   Clinical Impression  .Patient evaluated by Physical Therapy with no further acute PT needs identified. All education has been completed and the patient has no further questions. Pt is near his baseline only experiencing SoB with ascent and descent of flight of stairs. See below for any follow-up Physical Therapy or equipment needs. PT is signing off. Thank you for this referral.     Follow Up Recommendations No PT follow up    Equipment Recommendations  None recommended by PT    Recommendations for Other Services       Precautions / Restrictions Precautions Precautions: None Restrictions Weight Bearing Restrictions: No      Mobility  Bed Mobility               General bed mobility comments: seated in recliner at entry  Transfers Overall transfer level: Independent                  Ambulation/Gait Ambulation/Gait assistance: Independent Ambulation Distance (Feet): 250 Feet Assistive device: None Gait Pattern/deviations: WFL(Within Functional Limits) Gait velocity: WFL Gait velocity interpretation: at or above normal speed for age/gender General Gait Details: steady cadence, no LoB vc for looking up and out  Stairs Stairs: Yes Stairs assistance: Independent Stair Management: No rails;Forwards Number of Stairs: 15 General stair comments: slow, steady ascent/descent, DoE after stairs resolved quickly         Balance Overall balance assessment: Independent                                           Pertinent Vitals/Pain Pain Assessment: No/denies pain    Home Living Family/patient expects to be discharged to:: Private residence Living Arrangements:  Spouse/significant other Available Help at Discharge: Available 24 hours/day;Family Type of Home: House Home Access: Stairs to enter Entrance Stairs-Rails: Can reach both Entrance Stairs-Number of Steps: 3 Home Layout: Two level;Able to live on main level with bedroom/bathroom Home Equipment: Gilford Rile - 2 wheels;Bedside commode;Shower seat - built in;Shower seat;Hand held shower head;Grab bars - tub/shower      Prior Function Level of Independence: Independent               Hand Dominance   Dominant Hand: Right    Extremity/Trunk Assessment   Upper Extremity Assessment Upper Extremity Assessment: Overall WFL for tasks assessed    Lower Extremity Assessment Lower Extremity Assessment: Overall WFL for tasks assessed    Cervical / Trunk Assessment Cervical / Trunk Assessment: Normal  Communication   Communication: No difficulties  Cognition Arousal/Alertness: Awake/alert Behavior During Therapy: WFL for tasks assessed/performed                                          General Comments General comments (skin integrity, edema, etc.): at rest BP 152/108, HR 69 bpm, SaO2 on RA 97%, RR 19, after activity BP 170/118, HR 81 bpm, SaO2 on RA 98%, RR 22        Assessment/Plan    PT Assessment Patent  does not need any further PT services         PT Goals (Current goals can be found in the Care Plan section)  Acute Rehab PT Goals Patient Stated Goal: go home     AM-PAC PT "6 Clicks" Daily Activity  Outcome Measure Difficulty turning over in bed (including adjusting bedclothes, sheets and blankets)?: None Difficulty moving from lying on back to sitting on the side of the bed? : None Difficulty sitting down on and standing up from a chair with arms (e.g., wheelchair, bedside commode, etc,.)?: None Help needed moving to and from a bed to chair (including a wheelchair)?: None Help needed walking in hospital room?: None Help needed climbing 3-5 steps with  a railing? : None 6 Click Score: 24    End of Session Equipment Utilized During Treatment: Gait belt Activity Tolerance: Patient tolerated treatment well Patient left: in chair;with call bell/phone within reach;with family/visitor present Nurse Communication: Mobility status      Time: 1125-1151 PT Time Calculation (min) (ACUTE ONLY): 26 min   Charges:   PT Evaluation $PT Eval Moderate Complexity: 1 Mod PT Treatments $Gait Training: 8-22 mins   PT G Codes:        Azari Hasler B. Migdalia Dk PT, DPT Acute Rehabilitation  [phone] Pager [phone]    [place] 04/04/2017, 12:04 PM

## 2017-04-05 DIAGNOSIS — I481 Persistent atrial fibrillation: Secondary | ICD-10-CM | POA: Diagnosis not present

## 2017-04-05 DIAGNOSIS — I5031 Acute diastolic (congestive) heart failure: Secondary | ICD-10-CM | POA: Diagnosis not present

## 2017-04-05 LAB — CBC
HCT: 43.2 % (ref 39.0–52.0)
Hemoglobin: 14.5 g/dL (ref 13.0–17.0)
MCH: 31.6 pg (ref 26.0–34.0)
MCHC: 33.6 g/dL (ref 30.0–36.0)
MCV: 94.1 fL (ref 78.0–100.0)
PLATELETS: 210 10*3/uL (ref 150–400)
RBC: 4.59 MIL/uL (ref 4.22–5.81)
RDW: 13.6 % (ref 11.5–15.5)
WBC: 6 10*3/uL (ref 4.0–10.5)

## 2017-04-05 LAB — BASIC METABOLIC PANEL
ANION GAP: 6 (ref 5–15)
BUN: 11 mg/dL (ref 6–20)
CALCIUM: 9.1 mg/dL (ref 8.9–10.3)
CO2: 28 mmol/L (ref 22–32)
Chloride: 106 mmol/L (ref 101–111)
Creatinine, Ser: 1.03 mg/dL (ref 0.61–1.24)
GFR calc Af Amer: >60 mL/min (ref >60)
GLUCOSE: 96 mg/dL (ref 65–99)
Potassium: 3.8 mmol/L (ref 3.5–5.1)
SODIUM: 140 mmol/L (ref 135–145)

## 2017-04-05 MED ORDER — DILTIAZEM HCL ER COATED BEADS 240 MG PO CP24: 240.0000 mg | ORAL_CAPSULE | Freq: Every day | ORAL | 0 refills | Status: DC

## 2017-04-05 MED ORDER — FUROSEMIDE 20 MG PO TABS: 20.0000 mg | ORAL_TABLET | Freq: Every day | ORAL | 0 refills | Status: DC

## 2017-04-05 MED ORDER — SPIRONOLACTONE 25 MG PO TABS: 25.0000 mg | ORAL_TABLET | Freq: Every day | ORAL | 0 refills | Status: DC

## 2017-04-05 MED ORDER — APIXABAN 5 MG PO TABS: 5.0000 mg | ORAL_TABLET | Freq: Two times a day (BID) | ORAL | 0 refills | Status: DC

## 2017-04-05 MED ORDER — SPIRONOLACTONE 25 MG PO TABS
25.0000 mg | ORAL_TABLET | Freq: Every day | ORAL | Status: DC
  Administered 2017-04-05: 25 mg via ORAL
  Filled 2017-04-05: qty 1

## 2017-04-05 MED ORDER — DILTIAZEM HCL ER COATED BEADS 240 MG PO CP24
240.0000 mg | ORAL_CAPSULE | Freq: Every day | ORAL | Status: DC
  Administered 2017-04-05: 240 mg via ORAL
  Filled 2017-04-05: qty 1

## 2017-04-05 MED ORDER — METOPROLOL TARTRATE 50 MG PO TABS: 50.0000 mg | ORAL_TABLET | Freq: Two times a day (BID) | ORAL | Status: DC

## 2017-04-05 NOTE — Progress Notes (Signed)
Patient discharged home with wife. AVS and new scripts reviewed thoroughly with patient and wife. All questions answered. They both acknowledge follow up appointments as well as bleeding precautions on the Eliquis. VSS.

## 2017-04-05 NOTE — Progress Notes (Signed)
Subjective:  No complaints of shortness of breath or chest pain.  Blood pressure continues to be somewhat elevated.  Atrial fibrillation rate has been better controlled and he is currently off of diltiazem intravenously.    Objective:  Vital Signs in the last 24 hours: BP (!) 145/103 (BP Location: Left Arm)   Pulse 74   Temp 97.9 F (36.6 C) (Oral)   Resp 18   Ht 5\' 9"  (1.753 m)   Wt 101.4 kg (223 lb 9.6 oz)   SpO2 95%   BMI 33.02 kg/m   Physical Exam: Pleasant male in no acute distress Lungs:  Clear  Cardiac:  Irregular rhythm, normal S1 and S2, no S3 Abdomen:  Soft, nontender, no masses Extremities:  1+ edema present  Intake/Output from previous day: 08/21 0701 - 08/22 0700 In: 53 [I.V.:53] Out: 400 [Urine:400]  Weight Filed Weights   04/04/17 0319 04/04/17 0427 04/05/17 0348  Weight: 102.6 kg (226 lb 4.8 oz) 100.9 kg (222 lb 8 oz) 101.4 kg (223 lb 9.6 oz)    Lab Results: Basic Metabolic Panel:  Recent Labs  04/04/17 0335 04/05/17 0310  NA 140 140  K 3.7 3.8  CL 106 106  CO2 25 28  GLUCOSE 95 96  BUN 15 11  CREATININE 1.10 1.03   CBC:  Recent Labs  04/04/17 0335 04/05/17 0310  WBC 6.0 6.0  HGB 14.2 14.5  HCT 42.4 43.2  MCV 93.0 94.1  PLT 210 210   Cardiac Enzymes: Troponin (Point of Care Test)  Recent Labs  04/03/17 1408  TROPIPOC 0.02   BNP (last 3 results)  Recent Labs  04/04/17 0335  BNP 360.3*    Telemetry: Personally reviewed.  Atrial fibrillation currently with controlled ventricular response   Assessment/Plan:  1.  Persistent atrial fibrillation Currently with controlled rate response  2.  Hypertensive heart disease better controlled but still not controlled 3.  Congestive heart failure probable diastolic due to atrial fibrillation 4.  Long-term use of anticoagulation 5.  Obesity  Recommendations:  Clinically he is much better and I think he could be able to be discharged home.  His blood pressure still not well  controlled but we can work on this as an outpatient.  Long discussion about atrial fibrillation.  He will need to have an outpatient sleep study and we also talked about the contribution of weight to atrial fibrillation.  He should be discharged on  1.  Sustained release diltiazem 240 mg daily in the morning 2.  Metoprolol 50 mg twice daily ( one half of 100 mg tablet twice daily( 3.  Losartan 100 mg daily 4.  Spironolactone 25 mg daily 5.  Furosemide 20 mg daily. 6.  Atorvastatin as per his previous home dose. 7.  Eliquis 5 mg twice daily.  He was instructed to stay away from alcohol consumption at the time being and should not use aspirin while he is taking Eliquis.  After he has been anticoagulated for 3-4 weeks we can consider cardioversion or antiarrhythmic therapy.  He was asked to get an [employer] blood pressure cuff and monitor his blood pressure at home.  I would need to see him in my office in one week.      Kerry Hough  MD [facility]  04/05/2017, 9:22 AM

## 2017-04-05 NOTE — Progress Notes (Signed)
Family Medicine Teaching Service Daily Progress Note Intern Pager: [phone]  Patient name: Russell [name] Medical record number: [id number] Date of birth: 03/19/1941 Age: 76 y.o. Gender: male  Primary Care Provider: Binnie Rail, MD Consultants: cardio Code Status: full  Pt Overview and Major Events to Date:  Russell [name] a 76 y.o.malepresenting with SOB with new onset A-fib andRVR. PMH is significant for HTN, HLD, h/o prostate cancer.   Echo showed EF 50-55% with severely dilated atria.  Assessment and Plan: Russell [name] a 76 y.o.malepresenting with SOB with new onset A-fib andRVR. PMH is significant for HTN, HLD, h/o prostate cancer.   ??? For cardiology: Any further imaging/procedures we need to account for in d/c plan?  New onset A-Fib with RVR: EKG showing afib with rate controlled on diltiazem PO (80s on activity per PT).  - Admit to telemetry, attending Dr. Nori Riis.  BNP 360, echo shows EF50-55% w/ severely dilated atriums -cardiology will following, may clear 8/22 AM for d/c to outpatient cardioversion - continuous cardiac monitoring  - Continue home aspirin  - diltiazem TID PO currently per cards -Eliquis 5 mg BID , per cards  -Pt to receive eliquis education , per pharmacy  - Vitals per follow protocol  CHF(potential new diagnosis): BNP 360, echo shows EF50-55% w/ severely dilated atriums -metoprolol per cards -lasix per cards  Dyspnea:Resolved per patient although lungs still sound rough.  No concerns from PT evaluation w/ activity.  Potential chf exacerbation - Daily weights and Strict I/Os  - Monitor oxygen, if <92% on RA, supplement with Roland - OSA work up asoutpatient given body habitus  - Consider adding Lasix   Claims of assymetric swelling in legs (left leg larger)- Resolved. states it comes and goes and has for an undetermined amount of time.   Could be CHF related although assymetric would be uncommon.   Vascular  insufficiency seems less likely due to lask of paresthesia/motor control differences but might explain assymetric nature of complaint -did not feel it was currently happening -doppler US? -lasix per cards  Hypocalcemia- resolved  HLD: stable. - Continue home Lipitor   HTN: Uncontrolled, pt hypertensive on admission. 140s/100s overnight. At home on Cozaar 100 mg and Lopressor 100 mg daily.  - Continue home Cozaar  -metoprolol reduced dosing per cards -lasix per cards  History of prostate cancer: Resolved. 8 years ago.   FEN/GI: Heart healthydiet Prophylaxis: Eliquis   Disposition:home, initial pcp followup with [facility] with [facility] 8/27 @9am  w/ [place], will call cardio to schedule appt next week  Subjective:  Patient believes he can take his timed dose medications exactly as prescribed after we discussed the importance of this for heart conditions.   He has no pain and thought he was breathing well despite audible wheezes to me during our discussion.    Objective: Temp:  [97.4 F (36.3 C)-97.9 F (36.6 C)] 97.9 F (36.6 C) (08/22 0348) Pulse Rate:  [70-74] 74 (08/22 0348) Resp:  [18-31] 18 (08/22 0348) BP: (115-145)/(74-103) 145/103 (08/22 0348) SpO2:  [94 %-97 %] 95 % (08/22 0348) Weight:  [223 lb 9.6 oz (101.4 kg)] 223 lb 9.6 oz (101.4 kg) (08/22 0348) Physical Exam: General: NAD, pleasant,  Eyes:  EOMI, no conjunctival pallor or injection  Neck: Supple, no LAD  Cardiovascular:An irregularly irregular rhythmpresent. Rate app. 70. Exam reveals no friction rub.  No notable pitting edema. Respiratory:some mild crackles in lower lungs, no IWB noted, no wheezes  Gastrointestinal: soft, nontender, nondistended, normoactive BS  MSK: moves 4 extremities spontaneously  Derm: no rashes appreciated Psych: AOx3, appropriate affect   Laboratory:  Recent Labs Lab 04/03/17 1403 04/04/17 0335 04/05/17 0310  WBC 6.1 6.0 6.0  HGB 14.6 14.2 14.5  HCT 44.4 42.4 43.2  PLT 238  210 210    Recent Labs Lab 04/03/17 1403 04/04/17 0335 04/05/17 0310  NA 137 140 140  K 4.4 3.7 3.8  CL 108 106 106  CO2 20* 25 28  BUN 19 15 11   CREATININE 1.19 1.10 1.03  CALCIUM 8.9 8.7* 9.1  PROT  --  6.3*  --   BILITOT  --  1.0  --   ALKPHOS  --  86  --   ALT  --  42  --   AST  --  30  --   GLUCOSE 110* 95 96     Imaging/Diagnostic Tests: Dg Chest 2 View  Result Date: 04/03/2017 CLINICAL DATA:  Shortness of breath. EXAM: CHEST  2 VIEW COMPARISON:  None. FINDINGS: The heart size borderline. The hila and mediastinum are normal. Small bilateral pleural effusions with underlying atelectasis. No overt edema. No nodules or masses. Atelectasis or scar in the left base. No other acute abnormalities. IMPRESSION: Small bilateral effusions with underlying atelectasis. Electronically Signed   By: Dorise Bullion III M.D   On: 04/03/2017 15:04   Ct Angio Chest Pe W/cm &/or Wo Cm  Result Date: 04/03/2017 CLINICAL DATA:  Shortness of breath, fatigue EXAM: CT ANGIOGRAPHY CHEST WITH CONTRAST TECHNIQUE: Multidetector CT imaging of the chest was performed using the standard protocol during bolus administration of intravenous contrast. Multiplanar CT image reconstructions and MIPs were obtained to evaluate the vascular anatomy. CONTRAST:  100 mL Isovue 370 IV COMPARISON:  Chest radiographs dated 04/03/2017 FINDINGS: Cardiovascular: Poor opacification of the bilateral lower lobe pulmonary arteries due to bolus timing. Otherwise, satisfactory opacification of the bilateral pulmonary arteries to the lobar level. No evidence of pulmonary embolism. No evidence of thoracic aortic aneurysm. Atherosclerotic calcifications of the aortic root/valve and aortic arch. The heart is top-normal in size.  No pericardial effusion. 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 Mediastinum/Nodes: No suspicious mediastinal lymphadenopathy. Visualized thyroid is unremarkable. Lungs/Pleura: Evaluation of the  lung parenchyma is constrained by respiratory motion. Mild ground-glass opacities in the lungs bilaterally, suggesting mild interstitial edema. Associated small bilateral pleural effusions. No focal consolidation. No pleural effusion or pneumothorax. Upper Abdomen: Visualized upper abdomen is grossly unremarkable. Musculoskeletal: Degenerative changes of the visualized thoracolumbar spine. Review of the MIP images confirms the above findings. IMPRESSION: No evidence of pulmonary embolism. Suspected mild interstitial edema. Small bilateral pleural effusions. Aortic Atherosclerosis (ICD10-I70.0). Electronically Signed   By: Julian Hy M.D.   On: 04/03/2017 18:09     Sherene Sires, DO 04/05/2017, 6:18 AM PGY-1, [place] Intern pager: [phone], text pages welcome

## 2017-04-06 NOTE — Progress Notes (Signed)
Addition of omitted G-codes    2017-04-27 1130  PT G-Codes **NOT FOR INPATIENT CLASS**  Functional Assessment Tool Used AM-PAC 6 Clicks Basic Mobility  Functional Limitation Mobility: Walking and moving around  Mobility: Walking and Moving Around Current Status ([id number]) CH  Mobility: Walking and Moving Around Goal Status [phone]) CH  Mobility: Walking and Moving Around Discharge Status [phone]) [place] B. Migdalia Dk PT, DPT Acute Rehabilitation  [phone] Pager [phone]

## 2017-04-10 ENCOUNTER — Ambulatory Visit (INDEPENDENT_AMBULATORY_CARE_PROVIDER_SITE_OTHER): Payer: Medicare Other | Admitting: Family Medicine

## 2017-04-10 ENCOUNTER — Encounter: Payer: Self-pay | Admitting: Family Medicine

## 2017-04-10 VITALS — BP 121/80 | HR 67 | Temp 97.9°F | Ht 69.0 in | Wt 218.8 lb

## 2017-04-10 DIAGNOSIS — E669 Obesity, unspecified: Secondary | ICD-10-CM

## 2017-04-10 DIAGNOSIS — I482 Chronic atrial fibrillation, unspecified: Secondary | ICD-10-CM

## 2017-04-10 DIAGNOSIS — E785 Hyperlipidemia, unspecified: Secondary | ICD-10-CM | POA: Diagnosis not present

## 2017-04-10 NOTE — Assessment & Plan Note (Signed)
Patient is taking Diltiazem 240mg  once per day and Eliquis 5mg  BID for recent Afib with RVR. Patient will be seen by Cardiology tomorrow.   He will potentially undergo cardioversion in a few weeks if he is still in Afib. Instructed patient to return for follow up in one month.  Instructed patient to return immediately to the ED if he has symptoms of SOB, difficulty breathing, pain on taking deep breaths, chest pain, or loss of consciousness or feels like he may lose consciousness.

## 2017-04-10 NOTE — Assessment & Plan Note (Addendum)
Continue Atorvastatin 20mg . Recheck lipid panel in 11/2017  Last lipid panel in 10/2016: T. Chol 169 Tri 92 HDL 52 LDL89  No DM history or CVA history

## 2017-04-10 NOTE — Assessment & Plan Note (Signed)
Patient plans to continue his low carb diet and walk 30 min per day for 5 times per week.  Patient was instructed to maintain a low salt diet and encouraged to eat low carbs with fresh vegetables, and controlled portions of fruits and meats.  Will follow up in one month.

## 2017-04-10 NOTE — Progress Notes (Signed)
Subjective: No chief complaint on file.    HPI: Russell [name] is a 76 y.o. presenting to clinic today to discuss the following:  1 Afib w/RVR hospital follow up  Russell [name] is a 76y/o male with a PMH of HTN, CAD, Hyperlipidemia, and prostate cancer who is a new patient who wishes to establish care with our clinic after being seen by our service due to a hospitalization for Afib w/RVR.   Today he has no complaints and states he has all his medications he was prescribed after being discharged. He brought them with him today and he was able to tell me the correct amount and frequency for each medication.  He tested his home BP cuff and it seems to be off by more than 65HQIO systolic and 96EXBM diastolic. He stated he will get a new one and begin checking his BP at home every morning.  He stated he will begin a diet that is low in salt with restricted carb intake to help lose weight and begin walking 30 minutes a day 5 times per week.  He denies chest pain, palpitations, SOB, difficulty breathing, dizziness, syncope, or near syncope.  Health Maintenance: none      ROS noted in HPI.   Past Medical, Surgical, Social, and Family History Reviewed & Updated per EMR.   Pertinent Historical Findings include:   History  Smoking Status  . Former Smoker  . Quit date: 08/15/1978  Smokeless Tobacco  . Never Used    Comment: smoked age [age], up to 1ppd      Objective: BP 121/80 (BP Location: Left Arm, Patient Position: Sitting, Cuff Size: Normal)   Pulse 67   Temp 97.9 F (36.6 C) (Oral)   Ht 5\' 9"  (1.753 m)   Wt 218 lb 12.8 oz (99.2 kg)   SpO2 97%   BMI 32.31 kg/m  Vitals and nursing notes reviewed  Physical Exam  Gen: Alert and Oriented x 3, NAD HEENT: Normocephalic, atraumatic, PERRLA, EOMI Resp: CTAB, no wheezing, rales, or rhonchi, comfortable work of breathing CV: RRR, no murmurs, normal S1, S2 split, +2 pulses radial and dorsalis pedis bilaterally Abd:  non-distended, non-tender, soft, +bs in all four quadrants, no hepatosplenomegaly MSK: FROM in all four extremities Ext: no clubbing, cyanosis, trace edema bilaterally Skin: warm, dry, intact, no rashes   No results found for this or any previous visit (from the past 72 hour(s)).  Assessment/Plan:  Atrial fibrillation ([place]) Patient is taking Diltiazem 240mg  once per day and Eliquis 5mg  BID for recent Afib with RVR. Patient will be seen by Cardiology tomorrow.   He will potentially undergo cardioversion in a few weeks if he is still in Afib. Instructed patient to return for follow up in one month.  Instructed patient to return immediately to the ED if he has symptoms of SOB, difficulty breathing, pain on taking deep breaths, chest pain, or loss of consciousness or feels like he may lose consciousness.  Obesity (BMI 30-39.9) Patient plans to continue his low carb diet and walk 30 min per day for 5 times per week.  Patient was instructed to maintain a low salt diet and encouraged to eat low carbs with fresh vegetables, and controlled portions of fruits and meats.  Will follow up in one month.  Hyperlipidemia Continue Atorvastatin 20mg . Recheck lipid panel in 11/2017  Last lipid panel in 10/2016: T. Chol 169 Tri 92 HDL 52 LDL89  No DM history or CVA history  Please see problem based Assessment and Plan PATIENT EDUCATION PROVIDED: See AVS    Diagnosis and plan along with any newly prescribed medication(s) were discussed in detail with this patient today. The patient verbalized understanding and agreed with the plan. Patient advised if symptoms worsen return to clinic or ER.   Health Maintainance:   No orders of the defined types were placed in this encounter.   No orders of the defined types were placed in this encounter.    Harolyn Rutherford, DO 04/10/2017, 9:06 AM PGY-1, [place]

## 2017-04-10 NOTE — Patient Instructions (Addendum)
It was great to meet you today! Thank you for letting me participate in your care!  Today, we discussed Atrial Fibrillation for which you were recently hospitalized . You were started on diltiazem 240mg  once per day and  Eliquis 5mg  twice per day for your A Fib. Please continue to take these medications as they will keep your heart rate under control and decrease your risk of forming a blood clot.  Take the Furosemide as needed to reduce your lower extremity swelling.  If you develop concerning symptoms again like shortness of breath, difficulty breathing, pain on breathing, feeling lightheaded like you may pass out, or if you do loss consciousness please go immediately to the ED.  Be well, Harolyn Rutherford, DO PGY-1, [facility]

## 2017-04-11 DIAGNOSIS — I481 Persistent atrial fibrillation: Secondary | ICD-10-CM | POA: Diagnosis not present

## 2017-04-11 DIAGNOSIS — Z7901 Long term (current) use of anticoagulants: Secondary | ICD-10-CM | POA: Diagnosis not present

## 2017-04-11 DIAGNOSIS — I119 Hypertensive heart disease without heart failure: Secondary | ICD-10-CM | POA: Diagnosis not present

## 2017-04-11 DIAGNOSIS — I700 Atherosclerosis of aorta: Secondary | ICD-10-CM | POA: Diagnosis not present

## 2017-04-28 ENCOUNTER — Other Ambulatory Visit (HOSPITAL_COMMUNITY): Payer: Self-pay | Admitting: Psychiatry

## 2017-05-08 DIAGNOSIS — I700 Atherosclerosis of aorta: Secondary | ICD-10-CM | POA: Diagnosis not present

## 2017-05-08 DIAGNOSIS — Z7901 Long term (current) use of anticoagulants: Secondary | ICD-10-CM | POA: Diagnosis not present

## 2017-05-08 DIAGNOSIS — I119 Hypertensive heart disease without heart failure: Secondary | ICD-10-CM | POA: Diagnosis not present

## 2017-05-08 DIAGNOSIS — I481 Persistent atrial fibrillation: Secondary | ICD-10-CM | POA: Diagnosis not present

## 2017-05-11 ENCOUNTER — Ambulatory Visit: Payer: Medicare Other | Admitting: Family Medicine

## 2017-06-02 ENCOUNTER — Other Ambulatory Visit: Payer: Self-pay | Admitting: Cardiology

## 2017-06-02 DIAGNOSIS — I2510 Atherosclerotic heart disease of native coronary artery without angina pectoris: Secondary | ICD-10-CM | POA: Diagnosis not present

## 2017-06-02 DIAGNOSIS — Z7901 Long term (current) use of anticoagulants: Secondary | ICD-10-CM | POA: Diagnosis not present

## 2017-06-02 DIAGNOSIS — I119 Hypertensive heart disease without heart failure: Secondary | ICD-10-CM | POA: Diagnosis not present

## 2017-06-02 DIAGNOSIS — I700 Atherosclerosis of aorta: Secondary | ICD-10-CM | POA: Diagnosis not present

## 2017-06-02 DIAGNOSIS — I481 Persistent atrial fibrillation: Secondary | ICD-10-CM | POA: Diagnosis not present

## 2017-06-02 NOTE — H&P (Signed)
Russell [name]  Date of visit:  06/02/2017 DOB:  1940-10-08    Age:  76 yrs. Medical record number:  [id number]     Account number:  [id number] Primary Care Provider: Haywood Pao ____________________________ CURRENT DIAGNOSES  1. Persistent atrial fibrillation  2. Long term (current) use of anticoagulants  3. Hypertensive heart disease without heart failure  4. CAD Native without angina  5. Atherosclerosis of aorta  6. Obesity  7. Hyperlipidemia ____________________________ ALLERGIES  No Known Drug Allergies ____________________________ MEDICATIONS  1. atorvastatin 20 mg tablet, 1 p.o. daily  2. diltiazem ER 240 mg tablet,extended release 24 hr, 1 p.o. daily  3. Eliquis 5 mg tablet, BID  4. furosemide 20 mg tablet, 1 p.o. daily  5. losartan 100 mg tablet, 1 p.o. daily  6. metoprolol tartrate 100 mg tablet, 1/2 tab b.i.d.  7. spironolactone 25 mg tablet, 1 p.o. daily ____________________________ CHIEF COMPLAINTS  Followup of Persistent atrial fibrillation ____________________________ HISTORY OF PRESENT ILLNESS Patient is seen for preoperative cardiac evaluation prior to cardioversion. He has persistent atrial fibrillation that was diagnosed earlier in the summer as well as some diastolic heart failure. He has been anticoagulated and is diastolic heart failure has been under good control. He just returned from a trip to [place] and tolerated it fairly well without a lot of shortness of breath or other issues. He has moderate left and right atrial enlargement as well as some LVH noted. He is not currently having angina. He has been taking his anticoagulation consistently. After he had been rate controlled and on a period of anticoagulation for at least a month cardioversion has been recommended. ____________________________ PAST HISTORY  Past Medical Illnesses:  prostate cancer, hypertension, obesity, hyperlipidemia;  Cardiovascular Illnesses:  atrial fibrillation, CAD;   Infectious Diseases:  no previous history of significant infectious diseases;  Surgical Procedures:  prostate surgery;  Trauma History:  no previous history of significant trauma;  NYHA Classification:  I;  Canadian Angina Classification:  Class 0: Asymptomatic;  Cardiology Procedures-Invasive:  no previous interventional or invasive cardiology procedures;  Cardiology Procedures-Noninvasive:  echocardiogram August 2018;  Peripheral Vascular Procedures:  no previous invasive peripheral vascular procedures.;  LVEF of 55% documented via echocardiogram on 04/04/2017,   ____________________________ CARDIO-PULMONARY TEST DATES EKG Date:  05/08/2017;  Echocardiography Date: 04/04/2017;  Chest Xray Date: 04/03/2017;  CT Scan Date:  04/03/2017   ____________________________ FAMILY HISTORY Brother -- Brother alive and well Brother -- Brother alive with problem, Atrial fibrillation Father -- Father dead, Congestive heart failure, Father dead Mother -- Mother dead, Bowel cancer ____________________________ SOCIAL HISTORY Alcohol Use:  1 glass of wine daily;  Smoking:  used to smoke but quit;  Diet:  regular diet;  Lifestyle:  divorced and remarried;  Exercise:  some exercise, cycling and hiking;  Occupation:  semi-retired;  Residence:  lives with wife;   ____________________________ REVIEW OF SYSTEMS General:  feels well, no change in exercise tolerance., obesity Eyes: denies diplopia, history of glaucoma or visual problems. Respiratory: denies dyspnea, cough, wheezing or hemoptysis. Cardiovascular:  please review HPI  Genitourinary-Male: no dysuria, urgency, frequency, or nocturia  Musculoskeletal:  denies arthritis, venous insufficiency, or muscle weakness Neurological:  denies headaches, stroke, or TIA  ____________________________ PHYSICAL EXAMINATION VITAL SIGNS  Blood Pressure:  110/90 Sitting, Left arm, large cuff  , 100/80 Standing, Left arm and large cuff   Pulse:  68/min. Weight:  217.00 lbs.  Height:  69.00"BMI: 32  Constitutional:  pleasant white male in no acute distress,  mildly obese Skin:  warm and dry to touch, no apparent skin lesions, or masses noted. Head:  normocephalic, normal hair pattern, no masses or tenderness Neck:  supple, without massess. No JVD, thyromegaly or carotid bruits. Carotid upstroke normal. Chest:  normal symmetry, clear to auscultation. Cardiac:  irregularly irregular rhythm, normal S1 and S2, no S3 or S4, no murmurs,clicks, or rub heard Peripheral Pulses:  the femoral,dorsalis pedis, and posterior tibial pulses are full and equal bilaterally with no bruits auscultated. Extremities & Back:  trace edema, no spinal abnormalities noted., normal muscle strength and tone. Neurological:  no gross motor or sensory deficits noted, affect appropriate, oriented x3. ____________________________ MOST RECENT LIPID PANEL 11/10/16  CHOL TOTL 160 mg/dl, LDL 89 NM, HDL 52.2 mg/dl and TRIGLYCER 92 mg/dl ____________________________ IMPRESSIONS/PLAN  1. Persistent atrial fibrillation of uncertain duration 2. Hypertensive heart disease 3. Long-term use of anticoagulation 4. CAD and aortic atherosclerosis manifested by calcification  Recommendations:  Plan cardioversion hopefully next Friday. Cardioversion discussed with the patient including risks of stroke, arrhythmia, death, or anesthesia risks. The patient understands and is willing to proceed.  ____________________________ TODAYS ORDERS  1. Basic Metabolic Panel: Today  2. Complete Blood Count: Today  3. Electrical Cardioversion: At Patient Convenience                       ____________________________ Cardiology Physician:  Kerry Hough MD [facility]

## 2017-06-05 NOTE — H&P (Signed)
Russell [name]  Date of visit:  06/02/2017 DOB:  1940/10/28    Age:  76 yrs. Medical record number:  [id number]     Account number:  [id number] Primary Care Provider: Haywood Pao ____________________________ CURRENT DIAGNOSES  1. Persistent atrial fibrillation  2. Long term (current) use of anticoagulants  3. Hypertensive heart disease without heart failure  4. CAD Native without angina  5. Atherosclerosis of aorta  6. Obesity  7. Hyperlipidemia ____________________________ ALLERGIES  No Known Drug Allergies ____________________________ MEDICATIONS  1. atorvastatin 20 mg tablet, 1 p.o. daily  2. diltiazem ER 240 mg tablet,extended release 24 hr, 1 p.o. daily  3. Eliquis 5 mg tablet, BID  4. furosemide 20 mg tablet, 1 p.o. daily  5. losartan 100 mg tablet, 1 p.o. daily  6. metoprolol tartrate 100 mg tablet, 1/2 tab b.i.d.  7. spironolactone 25 mg tablet, 1 p.o. daily ____________________________ CHIEF COMPLAINTS  Followup of Persistent atrial fibrillation ____________________________ HISTORY OF PRESENT ILLNESS Patient is seen for preoperative cardiac evaluation prior to cardioversion. He has persistent atrial fibrillation that was diagnosed earlier in the summer as well as some diastolic heart failure. He has been anticoagulated and is diastolic heart failure has been under good control. He just returned from a trip to [place] and tolerated it fairly well without a lot of shortness of breath or other issues. He has moderate left and right atrial enlargement as well as some LVH noted. He is not currently having angina. He has been taking his anticoagulation consistently. After he had been rate controlled and on a period of anticoagulation for at least a month cardioversion has been recommended. ____________________________ PAST HISTORY  Past Medical Illnesses:  prostate cancer, hypertension, obesity, hyperlipidemia;  Cardiovascular Illnesses:  atrial fibrillation, CAD;   Infectious Diseases:  no previous history of significant infectious diseases;  Surgical Procedures:  prostate surgery;  Trauma History:  no previous history of significant trauma;  NYHA Classification:  I;  Canadian Angina Classification:  Class 0: Asymptomatic;  Cardiology Procedures-Invasive:  no previous interventional or invasive cardiology procedures;  Cardiology Procedures-Noninvasive:  echocardiogram August 2018;  Peripheral Vascular Procedures:  no previous invasive peripheral vascular procedures.;  LVEF of 55% documented via echocardiogram on 04/04/2017,   ____________________________ CARDIO-PULMONARY TEST DATES EKG Date:  05/08/2017;  Echocardiography Date: 04/04/2017;  Chest Xray Date: 04/03/2017;  CT Scan Date:  04/03/2017   ____________________________ FAMILY HISTORY Brother -- Brother alive and well Brother -- Brother alive with problem, Atrial fibrillation Father -- Father dead, Congestive heart failure, Father dead Mother -- Mother dead, Bowel cancer ____________________________ SOCIAL HISTORY Alcohol Use:  1 glass of wine daily;  Smoking:  used to smoke but quit;  Diet:  regular diet;  Lifestyle:  divorced and remarried;  Exercise:  some exercise, cycling and hiking;  Occupation:  semi-retired;  Residence:  lives with wife;   ____________________________ REVIEW OF SYSTEMS General:  feels well, no change in exercise tolerance., obesity Eyes: denies diplopia, history of glaucoma or visual problems. Respiratory: denies dyspnea, cough, wheezing or hemoptysis. Cardiovascular:  please review HPI  Genitourinary-Male: no dysuria, urgency, frequency, or nocturia  Musculoskeletal:  denies arthritis, venous insufficiency, or muscle weakness Neurological:  denies headaches, stroke, or TIA  ____________________________ PHYSICAL EXAMINATION VITAL SIGNS  Blood Pressure:  110/90 Sitting, Left arm, large cuff  , 100/80 Standing, Left arm and large cuff   Pulse:  68/min. Weight:  217.00 lbs.  Height:  69.00"BMI: 32  Constitutional:  pleasant white male in no acute  distress, mildly obese Skin:  warm and dry to touch, no apparent skin lesions, or masses noted. Head:  normocephalic, normal hair pattern, no masses or tenderness Neck:  supple, without massess. No JVD, thyromegaly or carotid bruits. Carotid upstroke normal. Chest:  normal symmetry, clear to auscultation. Cardiac:  irregularly irregular rhythm, normal S1 and S2, no S3 or S4, no murmurs,clicks, or rub heard Peripheral Pulses:  the femoral,dorsalis pedis, and posterior tibial pulses are full and equal bilaterally with no bruits auscultated. Extremities & Back:  trace edema, no spinal abnormalities noted., normal muscle strength and tone. Neurological:  no gross motor or sensory deficits noted, affect appropriate, oriented x3. ____________________________ MOST RECENT LIPID PANEL 11/10/16  CHOL TOTL 160 mg/dl, LDL 89 NM, HDL 52.2 mg/dl and TRIGLYCER 92 mg/dl ____________________________ IMPRESSIONS/PLAN  1. Persistent atrial fibrillation of uncertain duration 2. Hypertensive heart disease 3. Long-term use of anticoagulation 4. CAD and aortic atherosclerosis manifested by calcification  Recommendations:  Plan cardioversion hopefully next Friday. Cardioversion discussed with the patient including risks of stroke, arrhythmia, death, or anesthesia risks. The patient understands and is willing to proceed.  ____________________________ TODAYS ORDERS  1. Basic Metabolic Panel: Today  2. Complete Blood Count: Today  3. Electrical Cardioversion: At Patient Convenience                       ____________________________ Cardiology Physician:  Kerry Hough MD [facility]

## 2017-06-09 ENCOUNTER — Ambulatory Visit (HOSPITAL_COMMUNITY)
Admission: RE | Admit: 2017-06-09 | Discharge: 2017-06-09 | Disposition: A | Discharge status: Home / Self Care | Payer: Medicare Other | Source: Ambulatory Visit | Attending: Cardiology | Admitting: Cardiology

## 2017-06-09 ENCOUNTER — Encounter (HOSPITAL_COMMUNITY): Payer: Self-pay | Admitting: Certified Registered Nurse Anesthetist

## 2017-06-09 ENCOUNTER — Ambulatory Visit (HOSPITAL_COMMUNITY): Payer: Medicare Other | Admitting: Anesthesiology

## 2017-06-09 ENCOUNTER — Encounter (HOSPITAL_COMMUNITY)
Admission: RE | Disposition: A | Discharge status: Home / Self Care | Payer: Self-pay | Source: Ambulatory Visit | Attending: Cardiology

## 2017-06-09 DIAGNOSIS — E785 Hyperlipidemia, unspecified: Secondary | ICD-10-CM | POA: Insufficient documentation

## 2017-06-09 DIAGNOSIS — E669 Obesity, unspecified: Secondary | ICD-10-CM | POA: Insufficient documentation

## 2017-06-09 DIAGNOSIS — I700 Atherosclerosis of aorta: Secondary | ICD-10-CM | POA: Diagnosis not present

## 2017-06-09 DIAGNOSIS — Z7901 Long term (current) use of anticoagulants: Secondary | ICD-10-CM | POA: Diagnosis not present

## 2017-06-09 DIAGNOSIS — I25119 Atherosclerotic heart disease of native coronary artery with unspecified angina pectoris: Secondary | ICD-10-CM | POA: Diagnosis not present

## 2017-06-09 DIAGNOSIS — Z79899 Other long term (current) drug therapy: Secondary | ICD-10-CM | POA: Insufficient documentation

## 2017-06-09 DIAGNOSIS — I481 Persistent atrial fibrillation: Secondary | ICD-10-CM | POA: Diagnosis not present

## 2017-06-09 DIAGNOSIS — I119 Hypertensive heart disease without heart failure: Secondary | ICD-10-CM | POA: Diagnosis not present

## 2017-06-09 DIAGNOSIS — Z539 Procedure and treatment not carried out, unspecified reason: Secondary | ICD-10-CM | POA: Diagnosis not present

## 2017-06-09 DIAGNOSIS — Z87891 Personal history of nicotine dependence: Secondary | ICD-10-CM | POA: Diagnosis not present

## 2017-06-09 SURGERY — CANCELLED PROCEDURE

## 2017-06-09 MED ORDER — RIVAROXABAN 20 MG PO TABS: 20.0000 mg | ORAL_TABLET | Freq: Every day | ORAL | 12 refills | Status: DC

## 2017-06-09 MED ORDER — METOPROLOL SUCCINATE ER 100 MG PO TB24: 100.0000 mg | ORAL_TABLET | Freq: Every day | ORAL | Status: DC

## 2017-06-09 MED ORDER — METOPROLOL SUCCINATE ER 100 MG PO TB24: 100.0000 mg | ORAL_TABLET | Freq: Every day | ORAL | 12 refills | Status: DC

## 2017-06-09 MED ORDER — METOPROLOL SUCCINATE ER 100 MG PO TB24: 100.0000 mg | ORAL_TABLET | Freq: Every day | ORAL | 11 refills | Status: DC

## 2017-06-09 NOTE — Interval H&P Note (Signed)
History and Physical Interval Note:  06/09/2017 12:38 PM  Russell [name]  has presented today for surgery, with the diagnosis of AFIB  The various methods of treatment have been discussed with the patient and family. After consideration of risks, benefits and other options for treatment, the patient has consented to  Procedure(s): CARDIOVERSION (N/A) as a surgical intervention .  The patient's history has been reviewed, patient examined, no change in status, stable for surgery.  I have reviewed the patient's chart and labs.  Questions were answered to the patient's satisfaction.     [name]

## 2017-06-09 NOTE — Anesthesia Preprocedure Evaluation (Deleted)
Anesthesia Evaluation  Patient identified by MRN, date of birth, ID band Patient awake    Reviewed: Allergy & Precautions, NPO status , Patient's Chart, lab work & pertinent test results  Airway Mallampati: II  TM Distance: >3 FB Neck ROM: Full    Dental no notable dental hx.    Pulmonary former smoker,    breath sounds clear to auscultation       Cardiovascular hypertension, + CAD and + Peripheral Vascular Disease  + dysrhythmias Atrial Fibrillation  Rhythm:Irregular Rate:Normal     Neuro/Psych negative neurological ROS     GI/Hepatic negative GI ROS, Neg liver ROS,   Endo/Other  negative endocrine ROS  Renal/GU negative Renal ROS     Musculoskeletal negative musculoskeletal ROS (+)   Abdominal (+) + obese,   Peds  Hematology negative hematology ROS (+)   Anesthesia Other Findings   Reproductive/Obstetrics                            Anesthesia Physical Anesthesia Plan  ASA: III  Anesthesia Plan: General   Post-op Pain Management:    Induction: Intravenous  PONV Risk Score and Plan: 2 and Ondansetron and Dexamethasone  Airway Management Planned: Simple Face Mask and Mask  Additional Equipment:   Intra-op Plan:   Post-operative Plan:   Informed Consent: I have reviewed the patients History and Physical, chart, labs and discussed the procedure including the risks, benefits and alternatives for the proposed anesthesia with the patient or authorized representative who has indicated his/her understanding and acceptance.   Dental advisory given  Plan Discussed with: CRNA  Anesthesia Plan Comments:         Anesthesia Quick Evaluation

## 2017-06-09 NOTE — Progress Notes (Signed)
Patient has missed a dose of Eliquis on Weds night.   In light of this will postpone cardioversion for 3 weeks.    He finds it difficult to take medicine twice a day.  Will switch to Xarelto 20 mg daily and Metoprolol succinate 100 mg daily.  Folowup with me in 2 weeks.   Kerry Hough MD [facility] 1:24 PM

## 2017-06-09 NOTE — Progress Notes (Signed)
Patient admitted to Endo unit. Patient placed on monitor and confirmed atrial fibrillation. Patient informed RN that he had missed a dose of eliquis on Wednesday evening. MD made aware on arrival. MD made decision to cancel procedure and reschedule in 3 weeks. Patient rescheduled for Thursday, November 15th @ 1300. Patient discharged to home with instructions to not miss any more doses of eliquis. Patient cancelled prior to being in procedure room.

## 2017-06-14 DIAGNOSIS — E7800 Pure hypercholesterolemia, unspecified: Secondary | ICD-10-CM | POA: Diagnosis not present

## 2017-06-14 DIAGNOSIS — I480 Paroxysmal atrial fibrillation: Secondary | ICD-10-CM | POA: Diagnosis not present

## 2017-06-14 DIAGNOSIS — Z23 Encounter for immunization: Secondary | ICD-10-CM | POA: Diagnosis not present

## 2017-06-14 DIAGNOSIS — Z1389 Encounter for screening for other disorder: Secondary | ICD-10-CM | POA: Diagnosis not present

## 2017-06-14 DIAGNOSIS — I10 Essential (primary) hypertension: Secondary | ICD-10-CM | POA: Diagnosis not present

## 2017-06-22 NOTE — H&P (Signed)
Russell [name]  Date of visit:  06/22/2017 DOB:  [birth year]    Age:  76 yrs. Medical record number:  [id number]     Account number:  [id number] Primary Care Provider: Haywood Pao ____________________________ CURRENT DIAGNOSES  1. Persistent atrial fibrillation  2. Long term (current) use of anticoagulants  3. Hypertensive heart disease without heart failure  4. CAD Native without angina  5. Atherosclerosis of aorta  6. Obesity  7. Hyperlipidemia ____________________________ ALLERGIES  No Known Drug Allergies ____________________________ MEDICATIONS  1. atorvastatin 20 mg tablet, 1 p.o. daily  2. diltiazem ER 240 mg tablet,extended release 24 hr, 1 p.o. daily  3. furosemide 20 mg tablet, 1 p.o. daily  4. losartan 100 mg tablet, 1 p.o. daily  5. metoprolol succinate ER 100 mg tablet,extended release 24 hr, 1 p.o. daily  6. spironolactone 25 mg tablet, 1 p.o. daily  7. Xarelto 20 mg tablet, 1 p.o. daily ____________________________ HISTORY OF PRESENT ILLNESS Patient returns for cardiac followup. 2 weeks ago I had scheduled him for cardioversion but he had missed doses of anticoagulation and so we had to cancel it. He also is missing doses of his metoprolol and there were some compliance issues with twice-daily dosing. I changed him to once a day metoprolol succinate as well as Xarelto at that time. He returns today. He remains in atrial fibrillation by rhythm strip today. He is not really exercising. He doesn't have angina and has no bleeding complications from warfarin. He denies PND orthopnea or edema. ____________________________ PAST HISTORY  Past Medical Illnesses:  prostate cancer, hypertension, obesity, hyperlipidemia;  Cardiovascular Illnesses:  atrial fibrillation, CAD;  Infectious Diseases:  no previous history of significant infectious diseases;  Surgical Procedures:  prostate surgery;  Trauma History:  no previous history of significant trauma;  NYHA Classification:  I;   Canadian Angina Classification:  Class 0: Asymptomatic;  Cardiology Procedures-Invasive:  no previous interventional or invasive cardiology procedures;  Cardiology Procedures-Noninvasive:  echocardiogram August 2018;  Peripheral Vascular Procedures:  no previous invasive peripheral vascular procedures.;  LVEF of 55% documented via echocardiogram on 04/04/2017,   ____________________________ CARDIO-PULMONARY TEST DATES EKG Date:  05/08/2017;  Echocardiography Date: 04/04/2017;  Chest Xray Date: 04/03/2017;  CT Scan Date:  04/03/2017   ____________________________ FAMILY HISTORY Brother -- Brother alive and well Brother -- Brother alive with problem, Atrial fibrillation Father -- Father dead, Congestive heart failure, Father dead Mother -- Mother dead, Bowel cancer ____________________________ SOCIAL HISTORY Alcohol Use:  1 glass of wine daily;  Smoking:  used to smoke but quit;  Diet:  regular diet;  Lifestyle:  divorced and remarried;  Exercise:  some exercise, cycling and hiking;  Occupation:  semi-retired;  Residence:  lives with wife;   ____________________________ REVIEW OF SYSTEMS General:  feels well, no change in exercise tolerance., obesity Eyes: denies diplopia, history of glaucoma or visual problems. Respiratory: denies dyspnea, cough, wheezing or hemoptysis. Cardiovascular:  please review HPI  Genitourinary-Male: no dysuria, urgency, frequency, or nocturia  Musculoskeletal:  denies arthritis, venous insufficiency, or muscle weakness Neurological:  denies headaches, stroke, or TIA  ____________________________ PHYSICAL EXAMINATION VITAL SIGNS  Blood Pressure:  98/70 Sitting, Left arm, large cuff  , 96/70 Standing, Left arm and large cuff   Pulse:  68/min. Weight:  219.00 lbs. Height:  69.00"BMI: 32  Constitutional:  pleasant white male in no acute distress, mildly obese Skin:  warm and dry to touch, no apparent skin lesions, or masses noted. Head:  normocephalic, normal hair  pattern,  no masses or tenderness Neck:  supple, without massess. No JVD, thyromegaly or carotid bruits. Carotid upstroke normal. Chest:  normal symmetry, clear to auscultation. Cardiac:  irregularly irregular rhythm, normal S1 and S2, no S3 or S4, no murmurs,clicks, or rub heard Peripheral Pulses:  the femoral,dorsalis pedis, and posterior tibial pulses are full and equal bilaterally with no bruits auscultated. Extremities & Back:  trace edema, no spinal abnormalities noted., normal muscle strength and tone. Neurological:  no gross motor or sensory deficits noted, affect appropriate, oriented x3. ____________________________ MOST RECENT LIPID PANEL 11/10/16  CHOL TOTL 160 mg/dl, LDL 89 NM, HDL 52.2 mg/dl and TRIGLYCER 92 mg/dl ____________________________ IMPRESSIONS/PLAN  1. Persistent atrial fibrillation 2. Long term use of anticoagulation with no missed doses 3. Hypertensive heart disease controlled  Recommendations:  He has been anticoagulated now for 2 weeks and will plan cardioversion again next Thursday. He understands he is not to miss any doses.Cardioversion discussed with the patient including risks of stroke, arrhythmia, death, or anesthesia risks. The patient understands and is willing to proceed. ____________________________ TODAYS ORDERS  1. Basic Metabolic Panel: Today  2. Complete Blood Count: Today  3. Electrical Cardioversion: 1 week                       ____________________________ Cardiology Physician:  Kerry Hough MD [facility]

## 2017-06-29 ENCOUNTER — Encounter (HOSPITAL_COMMUNITY)
Admission: RE | Disposition: A | Discharge status: Home / Self Care | Payer: Self-pay | Source: Ambulatory Visit | Attending: Cardiology

## 2017-06-29 ENCOUNTER — Encounter (HOSPITAL_COMMUNITY): Payer: Self-pay

## 2017-06-29 ENCOUNTER — Ambulatory Visit (HOSPITAL_COMMUNITY)
Admission: RE | Admit: 2017-06-29 | Discharge: 2017-06-29 | Disposition: A | Discharge status: Home / Self Care | Payer: Medicare Other | Source: Ambulatory Visit | Attending: Cardiology | Admitting: Cardiology

## 2017-06-29 ENCOUNTER — Ambulatory Visit (HOSPITAL_COMMUNITY): Payer: Medicare Other | Admitting: Anesthesiology

## 2017-06-29 DIAGNOSIS — Z9119 Patient's noncompliance with other medical treatment and regimen: Secondary | ICD-10-CM | POA: Insufficient documentation

## 2017-06-29 DIAGNOSIS — Z79899 Other long term (current) drug therapy: Secondary | ICD-10-CM | POA: Insufficient documentation

## 2017-06-29 DIAGNOSIS — I481 Persistent atrial fibrillation: Secondary | ICD-10-CM | POA: Diagnosis present

## 2017-06-29 DIAGNOSIS — Z8546 Personal history of malignant neoplasm of prostate: Secondary | ICD-10-CM | POA: Diagnosis not present

## 2017-06-29 DIAGNOSIS — Z87891 Personal history of nicotine dependence: Secondary | ICD-10-CM | POA: Diagnosis not present

## 2017-06-29 DIAGNOSIS — I700 Atherosclerosis of aorta: Secondary | ICD-10-CM | POA: Diagnosis not present

## 2017-06-29 DIAGNOSIS — I119 Hypertensive heart disease without heart failure: Secondary | ICD-10-CM | POA: Diagnosis not present

## 2017-06-29 DIAGNOSIS — E669 Obesity, unspecified: Secondary | ICD-10-CM | POA: Diagnosis not present

## 2017-06-29 DIAGNOSIS — Z7901 Long term (current) use of anticoagulants: Secondary | ICD-10-CM | POA: Diagnosis not present

## 2017-06-29 DIAGNOSIS — E785 Hyperlipidemia, unspecified: Secondary | ICD-10-CM | POA: Diagnosis not present

## 2017-06-29 DIAGNOSIS — Z6831 Body mass index (BMI) 31.0-31.9, adult: Secondary | ICD-10-CM | POA: Insufficient documentation

## 2017-06-29 DIAGNOSIS — I2510 Atherosclerotic heart disease of native coronary artery without angina pectoris: Secondary | ICD-10-CM | POA: Diagnosis not present

## 2017-06-29 DIAGNOSIS — I739 Peripheral vascular disease, unspecified: Secondary | ICD-10-CM | POA: Diagnosis not present

## 2017-06-29 HISTORY — PX: CARDIOVERSION: SHX1299

## 2017-06-29 SURGERY — CARDIOVERSION
Anesthesia: General

## 2017-06-29 MED ORDER — LIDOCAINE HCL (CARDIAC) 20 MG/ML IV SOLN
INTRAVENOUS | Status: DC | PRN
  Administered 2017-06-29: 40 mg via INTRAVENOUS

## 2017-06-29 MED ORDER — SODIUM CHLORIDE 0.9 % IV SOLN
INTRAVENOUS | Status: DC
  Administered 2017-06-29: 12:00:00 via INTRAVENOUS

## 2017-06-29 MED ORDER — PROPOFOL 10 MG/ML IV BOLUS
INTRAVENOUS | Status: DC | PRN
  Administered 2017-06-29: 80 mg via INTRAVENOUS

## 2017-06-29 MED ORDER — HYDROCORTISONE 1 % EX CREA: 1.0000 "application " | TOPICAL_CREAM | Freq: Three times a day (TID) | CUTANEOUS | Status: DC | PRN

## 2017-06-29 MED ORDER — SODIUM CHLORIDE 0.9 % IV SOLN
INTRAVENOUS | Status: DC
  Administered 2017-06-29: 13:00:00 via INTRAVENOUS

## 2017-06-29 NOTE — Anesthesia Postprocedure Evaluation (Signed)
Anesthesia Post Note  Patient: Russell [name]  Procedure(s) Performed: CARDIOVERSION (N/A )     Patient location during evaluation: PACU Anesthesia Type: General Pain management: pain level controlled Vital Signs Assessment: post-procedure vital signs reviewed and stable Respiratory status: spontaneous breathing Cardiovascular status: stable Anesthetic complications: no    Last Vitals:  Vitals:   06/29/17 1352 06/29/17 1358  BP: 93/69 109/67  Pulse: (!) 57 (!) 56  Resp: 18 17  Temp:  36.7 C  SpO2: 100% 100%    Last Pain:  Vitals:   06/29/17 1358  TempSrc: Oral                 Gladyse Corvin

## 2017-06-29 NOTE — Interval H&P Note (Signed)
History and Physical Interval Note:  06/29/2017 1:37 PM  Russell [name]  has presented today for surgery, with the diagnosis of a fib  The various methods of treatment have been discussed with the patient and family. After consideration of risks, benefits and other options for treatment, the patient has consented to  Procedure(s): CARDIOVERSION (N/A) as a surgical intervention .  The patient's history has been reviewed, patient examined, no change in status, stable for surgery.  I have reviewed the patient's chart and labs.  Questions were answered to the patient's satisfaction.     [name]

## 2017-06-29 NOTE — Transfer of Care (Signed)
Immediate Anesthesia Transfer of Care Note  Patient: Russell [name]  Procedure(s) Performed: CARDIOVERSION (N/A )  Patient Location: PACU  Anesthesia Type:General  Level of Consciousness: sedated and patient cooperative  Airway & Oxygen Therapy: Patient Spontanous Breathing and Patient connected to nasal cannula oxygen  Post-op Assessment: Report given to RN and Post -op Vital signs reviewed and stable  Post vital signs: Reviewed and stable  Last Vitals:  Vitals:   06/29/17 1346 06/29/17 1347  BP:    Pulse: (!) 55 (!) 55  Resp: 19 18  Temp:    SpO2: 100% 100%    Last Pain:  Vitals:   06/29/17 1202  TempSrc: Oral         Complications: No apparent anesthesia complications

## 2017-06-29 NOTE — CV Procedure (Signed)
Electrical Cardioversion Procedure Note  Russell [name]   76 y.o. male MRN: [id number] DOB: 10/01/1940  Today's date: 06/29/2017  Procedure: Electrical Cardioversion  Indications:  Atrial Fibrillation  Time Out: Verified patient identification, verified procedure,medications/allergies/relevent history reviewed, required imaging and test results available.  Performed  Procedure Details  The patient was NPO after midnight. Anesthesia was administered at the beside  by Dr.Green with 40 mg of lidocaine and 85 mg of propofol.  Cardioversion was done with synchronized biphasic defibrillation with AP pads with 150 watts.  The patient converted to normal sinus rhythm. The patient tolerated the procedure well   IMPRESSION:  Successful cardioversion of atrial fibrillation    W. Tollie Eth, Brooke Bonito. MD [facility]   06/29/2017, 1:47 PM

## 2017-06-29 NOTE — Anesthesia Procedure Notes (Signed)
Procedure Name: MAC Date/Time: 06/29/2017 1:29 PM Performed by: Eligha Bridegroom, CRNA Pre-anesthesia Checklist: Patient identified, Emergency Drugs available, Suction available, Patient being monitored and Timeout performed Patient Re-evaluated:Patient Re-evaluated prior to induction Oxygen Delivery Method: Ambu bag Induction Type: IV induction

## 2017-06-29 NOTE — Anesthesia Preprocedure Evaluation (Signed)
Anesthesia Evaluation  Patient identified by MRN, date of birth, ID band  Reviewed: Allergy & Precautions, NPO status , Patient's Chart, lab work & pertinent test results  Airway Mallampati: II  TM Distance: >3 FB     Dental   Pulmonary former smoker,    breath sounds clear to auscultation       Cardiovascular hypertension, + CAD and + Peripheral Vascular Disease   Rhythm:Irregular Rate:Normal     Neuro/Psych    GI/Hepatic negative GI ROS, Neg liver ROS,   Endo/Other  negative endocrine ROS  Renal/GU negative Renal ROS     Musculoskeletal   Abdominal   Peds  Hematology   Anesthesia Other Findings   Reproductive/Obstetrics                             Anesthesia Physical Anesthesia Plan  ASA: III  Anesthesia Plan: General   Post-op Pain Management:    Induction: Intravenous  PONV Risk Score and Plan: 2 and Treatment may vary due to age or medical condition  Airway Management Planned: Mask and Simple Face Mask  Additional Equipment:   Intra-op Plan:   Post-operative Plan:   Informed Consent: I have reviewed the patients History and Physical, chart, labs and discussed the procedure including the risks, benefits and alternatives for the proposed anesthesia with the patient or authorized representative who has indicated his/her understanding and acceptance.   Dental advisory given  Plan Discussed with: CRNA, Anesthesiologist and Surgeon  Anesthesia Plan Comments:         Anesthesia Quick Evaluation

## 2017-06-29 NOTE — Discharge Instructions (Signed)
Electrical Cardioversion, Care After °This sheet gives you information about how to care for yourself after your procedure. Your health care provider may also give you more specific instructions. If you have problems or questions, contact your health care provider. °What can I expect after the procedure? °After the procedure, it is common to have: °· Some redness on the skin where the shocks were given. ° °Follow these instructions at home: °· Do not drive for 24 hours if you were given a medicine to help you relax (sedative). °· Take over-the-counter and prescription medicines only as told by your health care provider. °· Ask your health care provider how to check your pulse. Check it often. °· Rest for 48 hours after the procedure or as told by your health care provider. °· Avoid or limit your caffeine use as told by your health care provider. °Contact a health care provider if: °· You feel like your heart is beating too quickly or your pulse is not regular. °· You have a serious muscle cramp that does not go away. °Get help right away if: °· You have discomfort in your chest. °· You are dizzy or you feel faint. °· You have trouble breathing or you are short of breath. °· Your speech is slurred. °· You have trouble moving an arm or leg on one side of your body. °· Your fingers or toes turn cold or blue. °This information is not intended to replace advice given to you by your health care provider. Make sure you discuss any questions you have with your health care provider. °Document Released: 05/22/2013 Document Revised: 03/04/2016 Document Reviewed: 02/05/2016 °Elsevier Interactive Patient Education © 2018 [employer] ° °

## 2017-07-03 NOTE — Addendum Note (Signed)
Addendum  created 07/03/17 1646 by Belinda Block, MD   Intraprocedure Staff edited

## 2017-11-27 ENCOUNTER — Other Ambulatory Visit: Payer: Self-pay | Admitting: Internal Medicine

## 2017-11-27 DIAGNOSIS — O10019 Pre-existing essential hypertension complicating pregnancy, unspecified trimester: Secondary | ICD-10-CM

## 2017-12-28 ENCOUNTER — Encounter: Payer: Self-pay | Admitting: Gastroenterology

## 2018-08-17 ENCOUNTER — Telehealth: Payer: Self-pay | Admitting: Internal Medicine

## 2018-08-17 NOTE — Telephone Encounter (Signed)
Russell [name]  called Pt previously followed by Tollie Eth Needs to reestablish with a cardiologist Please set up to see me in the next several weeks- 1 month

## 2018-08-21 NOTE — Telephone Encounter (Signed)
Called patient and scheduled for 10/08/18, 8am. Pt infomed of location. Message to chrt prep.

## 2018-10-08 ENCOUNTER — Encounter: Payer: Self-pay | Admitting: Internal Medicine

## 2018-10-08 ENCOUNTER — Ambulatory Visit: Payer: Medicare Other | Admitting: Internal Medicine

## 2018-10-08 VITALS — BP 118/76 | HR 84 | Ht 68.0 in | Wt 230.8 lb

## 2018-10-08 DIAGNOSIS — I4819 Other persistent atrial fibrillation: Secondary | ICD-10-CM

## 2018-10-08 DIAGNOSIS — E782 Mixed hyperlipidemia: Secondary | ICD-10-CM

## 2018-10-08 DIAGNOSIS — I10 Essential (primary) hypertension: Secondary | ICD-10-CM | POA: Diagnosis not present

## 2018-10-08 DIAGNOSIS — I480 Paroxysmal atrial fibrillation: Secondary | ICD-10-CM

## 2018-10-08 DIAGNOSIS — I2510 Atherosclerotic heart disease of native coronary artery without angina pectoris: Secondary | ICD-10-CM

## 2018-10-08 NOTE — Patient Instructions (Signed)
Medication Instructions:  The current medical regimen is effective;  continue present plan and medications.  If you need a refill on your cardiac medications before your next appointment, please call your pharmacy.   Follow-Up: At [facility], you and your health needs are our priority.  As part of our continuing mission to provide you with exceptional heart care, we have created designated Provider Care Teams.  These Care Teams include your primary Cardiologist (physician) and Advanced Practice Providers (APPs -  Physician Assistants and Nurse Practitioners) who all work together to provide you with the care you need, when you need it. You will need a follow up appointment in:  7 months/September.  Please call our office 2 months in advance to schedule this appointment.  You may see Dr Dorris Carnes. or one of the following Advanced Practice Providers on your designated Care Team: Richardson Dopp, PA-C [place], [place] . Daune Perch, NP  Thank you for choosing [facility]!!

## 2018-10-08 NOTE — Progress Notes (Signed)
Cardiology Office Note   Date:  10/08/2018   ID:  Russell, [name] [birth year], MRN [id number]  PCP:  Haywood Pao, MD  Cardiologist:   Dorris Carnes, MD   Pt presents to establish care  Previously followed by Thurman Coyer     History of Present Illness: Russell [name] is a 78 y.o. male with a history of CAD (by CT), HTN, HL and  atrial fibrillation   In 2018 he experienced SOB   Went to ED   Found to be in afib   Started anticoagulant Rx and was cardioverted   Never had palpitations Echo showed LVEF 55%  Since then his breathing has been good   He denies palpitations   No dizziness   No CP   Followed by Thurman Coyer in the past    Admits he needs to exercise more    Current Meds  Medication Sig  . atorvastatin (LIPITOR) 20 MG tablet Take 1 tablet (20 mg total) by mouth daily. -- Office visit needed for further refills  . diltiazem (CARDIZEM CD) 240 MG 24 hr capsule Take 1 capsule (240 mg total) by mouth daily.  . furosemide (LASIX) 20 MG tablet Take 1 tablet (20 mg total) by mouth daily.  Marland Kitchen losartan (COZAAR) 100 MG tablet TAKE ONE TABLET (100 MG) BY MOUTH EVERY MORNING -- Office visit needed for further refills  . metoprolol succinate (TOPROL-XL) 100 MG 24 hr tablet Take 1 tablet (100 mg total) by mouth daily. Take with or immediately following a meal.  . rivaroxaban (XARELTO) 20 MG TABS tablet Take 1 tablet (20 mg total) by mouth daily with breakfast.  . spironolactone (ALDACTONE) 25 MG tablet Take 1 tablet (25 mg total) by mouth daily.  . [DISCONTINUED] pravastatin (PRAVACHOL) 20 MG tablet Take 20 mg daily by mouth.     Allergies:   Patient has no known allergies.   Past Medical History:  Diagnosis Date  . Diverticulosis   . Habitual alcohol use   . History of prostate cancer    Dr.Peterson  . History of skin cancer    basal cell, Dr.Turner   . Hx of colonic polyp    Dr Sharlett Iles  . Hyperlipidemia    NMR 2011, LDL 103 (1410/120),HDL 73, TG 76. LDL goal =<130  .  Hypertension   . Hypertensive encephalopathy    08/01/2009-08/03/2009    Past Surgical History:  Procedure Laterality Date  . CARDIOVERSION N/A 06/29/2017   Procedure: CARDIOVERSION;  Surgeon: Jacolyn Reedy, MD;  Location: [facility] END[facility]COPY;  Service: Cardiovascular;  Laterality: N/A;  . CATARACT EXTRACTION  2010   OD, Dr.Digby  . COLONOSCOPY W/ POLYPECTOMY     2000, Neg [id number] & 2014. Dr.Patterson  . PROSTATECTOMY  2002   Radiation treatmens 2002-2003  . TONSILLECTOMY       Social History:  The patient  reports that he quit smoking about 40 years ago. He has never used smokeless tobacco. He reports current alcohol use of about 10.0 standard drinks of alcohol per week. He reports that he does not use drugs.   Family History:  The patient's family history includes Atrial fibrillation in his father; Colon cancer (age of onset: 49) in his mother; Heart attack (age of onset: 31) in his maternal grandfather; Pancreatic cancer in his maternal grandmother; Prostate cancer (age of onset: 4) in his brother; Stomach cancer in his paternal grandfather.    ROS:  Please see the history of present illness. All other  systems are reviewed and  Negative to the above problem except as noted.    PHYSICAL EXAM: VS:  BP 118/76 (BP Location: Left Arm, Patient Position: Sitting)   Pulse 84   Ht 5\' 8"  (1.727 m)   Wt 230 lb 12.8 oz (104.7 kg)   SpO2 98%   BMI 35.09 kg/m   GEN: Morbidly obese 78 yo  in no acute distress  HEENT: normal  Neck: no JVD, carotid bruits, or masses Cardiac: RRR; no murmurs, rubs, or gallops,no edema  Respiratory:  clear to auscultation bilaterally, normal work of breathing GI: soft, nontender, nondistended, + BS  No hepatomegaly  MS: no deformity Moving all extremities   Skin: warm and dry, no rash Neuro:  Strength and sensation are intact Psych: euthymic mood, full affect   EKG:  EKG is ordered today.SR 84 bpm   RBBB    Lipid Panel    Component Value Date/Time     CHOL 160 11/10/2016 0759   TRIG 92.0 11/10/2016 0759   HDL 52.20 11/10/2016 0759   CHOLHDL 3 11/10/2016 0759   VLDL 18.4 11/10/2016 0759   LDLCALC 89 11/10/2016 0759   LDLDIRECT 127.3 09/13/2011 1151      Wt Readings from Last 3 Encounters:  10/08/18 230 lb 12.8 oz (104.7 kg)  06/29/17 211 lb (95.7 kg)  06/09/17 211 lb (95.7 kg)      ASSESSMENT AND PLAN:  1  PAF   Currently in SR   It sounds like he has not had much afib   Follow   Keep on anticoagulant  2   CAD   On CT scan   Aysmptomatic  3   HL  Will get labs from Dr Osborne Casco   Need aggressive control of LDL   4   HTN   BP is OK    F/U in 6 months      Current medicines are reviewed at length with the patient today.  The patient does not have concerns regarding medicines.  Signed, Dorris Carnes, MD  10/08/2018 8:37 AM    [address], [place], [place]  [address] Phone: [phone]; Fax: [phone]

## 2019-03-18 ENCOUNTER — Telehealth: Payer: Self-pay | Admitting: Internal Medicine

## 2019-03-18 DIAGNOSIS — E782 Mixed hyperlipidemia: Secondary | ICD-10-CM

## 2019-03-18 NOTE — Telephone Encounter (Signed)
Left message to call back  

## 2019-03-18 NOTE — Telephone Encounter (Signed)
Receiived labs from Dr Sonda Primes office   Pt had liipds this spring   DLDL 108  HDL 59   He is on 20 mg Lipitor   With plaquing seen on coronary arteries on CT need  Tighter control of LDL Would increase lipitor to 80 mg    F/U Lipids and AST in 8 wks

## 2019-03-20 ENCOUNTER — Telehealth: Payer: Self-pay | Admitting: Internal Medicine

## 2019-03-20 MED ORDER — ATORVASTATIN CALCIUM 80 MG PO TABS: 80.0000 mg | ORAL_TABLET | Freq: Every day | ORAL | 3 refills | Status: AC

## 2019-03-20 NOTE — Telephone Encounter (Signed)
Returned call to patient. Made him aware of Dr. Harrington Challenger' recommendations below. Patient verbalized understanding. Rx sent to preferred pharmacy. Patient will have his fasting labs checked at his appt on 10/1.

## 2019-03-20 NOTE — Telephone Encounter (Signed)
See phone note from 8/3

## 2019-03-20 NOTE — Telephone Encounter (Signed)
New message:     Patient returning call from yesterday. Please call patient.

## 2019-03-20 NOTE — Addendum Note (Signed)
Addended by: Drue Novel I on: 03/20/2019 11:00 AM   Modules accepted: Orders

## 2019-03-29 ENCOUNTER — Encounter: Payer: Self-pay | Admitting: Gastroenterology

## 2019-03-29 ENCOUNTER — Ambulatory Visit (INDEPENDENT_AMBULATORY_CARE_PROVIDER_SITE_OTHER): Payer: Medicare Other | Admitting: Gastroenterology

## 2019-03-29 ENCOUNTER — Telehealth: Payer: Self-pay

## 2019-03-29 VITALS — BP 112/72 | HR 68 | Temp 98.6°F | Ht 69.0 in | Wt 232.0 lb

## 2019-03-29 DIAGNOSIS — Z7901 Long term (current) use of anticoagulants: Secondary | ICD-10-CM | POA: Diagnosis not present

## 2019-03-29 DIAGNOSIS — Z800 Family history of malignant neoplasm of digestive organs: Secondary | ICD-10-CM

## 2019-03-29 MED ORDER — SUPREP BOWEL PREP KIT 17.5-3.13-1.6 GM/177ML PO SOLN: ORAL | 0 refills | Status: DC

## 2019-03-29 NOTE — Progress Notes (Signed)
HPI :  78 y/o male with a history of CAD,  Xarelto use for atrial fibrillation, prior patient of Dr. Sharlett Iles last seen in 2014, here to establish care with me to discuss colonoscopy.   He has a family history of colon cancer diagnosed in his mother who passed from it at age 69.  He reports having colonoscopies every 5 years or so historically.  His last 2 exams he has not had any polyps.  His last colonoscopy was performed in May 2014, good prep reported.  He denies any problems with his bowels at all.  He denies any abdominal pains.  No blood in his stools.  No weight loss.  He feels well in general, he denies any cardiopulmonary symptoms at all.  He has questions about holding the Xarelto for colonoscopy if he wants to proceed.  He asked about stool based testing for colon cancer.  He also asked about what age to stop colon cancer screening.  We had a lengthy discussion about these issues as outlined below.   Colonoscopy 12/2012 - good prep, no polyps., diverticulosis.  Echo 03/2017 - EF 50-55%  Past Medical History:  Diagnosis Date  . CAD (coronary artery disease)   . Diverticulosis 12/2012   colonoscopy  . Family hx of colon cancer    mother  . Habitual alcohol use   . History of prostate cancer    Dr.Peterson  . History of skin cancer    basal cell, Dr.Turner   . Hx of colonic polyp    Dr Sharlett Iles  . Hyperlipidemia    NMR 2011, LDL 103 (1410/120),HDL 73, TG 76. LDL goal =<130  . Hypertension   . Hypertensive encephalopathy    08/01/2009-08/03/2009     Past Surgical History:  Procedure Laterality Date  . CARDIOVERSION N/A 06/29/2017   Procedure: CARDIOVERSION;  Surgeon: Jacolyn Reedy, MD;  Location: [facility] ENDOSCOPY;  Service: Cardiovascular;  Laterality: N/A;  . CATARACT EXTRACTION  2010   OD, Dr.Digby  . COLONOSCOPY W/ POLYPECTOMY     2000, Neg [id number] & 2014. Dr.Patterson  . PROSTATECTOMY  2002   Radiation treatmens 2002-2003  . TONSILLECTOMY     Family  History  Problem Relation Age of Onset  . Atrial fibrillation Father   . Colon cancer Mother 36  . Prostate cancer Brother 66  . Heart attack Maternal Grandfather 68  . Pancreatic cancer Maternal Grandmother   . Stomach cancer Paternal Grandfather   . Diabetes Neg Hx   . Stroke Neg Hx   . Esophageal cancer Neg Hx   . Rectal cancer Neg Hx    Social History   Tobacco Use  . Smoking status: Former Smoker    Quit date: 08/15/1978    Years since quitting: 40.6  . Smokeless tobacco: Never Used  . Tobacco comment: smoked age [age], up to 1ppd  Substance Use Topics  . Alcohol use: Yes    Alcohol/week: 10.0 standard drinks    Types: 10 Glasses of wine per week    Comment: Drinks 2-3 drinks per day  . Drug use: No   Current Outpatient Medications  Medication Sig Dispense Refill  . atorvastatin (LIPITOR) 80 MG tablet Take 1 tablet (80 mg total) by mouth daily. 90 tablet 3  . diltiazem (CARDIZEM CD) 240 MG 24 hr capsule Take 1 capsule (240 mg total) by mouth daily. 30 capsule 0  . furosemide (LASIX) 20 MG tablet Take 1 tablet (20 mg total) by mouth daily. 30 tablet  0  . losartan (COZAAR) 100 MG tablet TAKE ONE TABLET (100 MG) BY MOUTH EVERY MORNING -- Office visit needed for further refills 30 tablet 0  . metoprolol succinate (TOPROL-XL) 100 MG 24 hr tablet Take 1 tablet (100 mg total) by mouth daily. Take with or immediately following a meal. 30 tablet 12  . rivaroxaban (XARELTO) 20 MG TABS tablet Take 1 tablet (20 mg total) by mouth daily with breakfast. 30 tablet 12  . spironolactone (ALDACTONE) 25 MG tablet Take 1 tablet (25 mg total) by mouth daily. 30 tablet 0   No current facility-administered medications for this visit.    No Known Allergies   Review of Systems: All systems reviewed and negative except where noted in HPI.   Lab Results  Component Value Date   WBC 6.0 04/05/2017   HGB 14.5 04/05/2017   HCT 43.2 04/05/2017   MCV 94.1 04/05/2017   PLT 210 04/05/2017     Lab Results  Component Value Date   CREATININE 1.03 04/05/2017   BUN 11 04/05/2017   NA 140 04/05/2017   K 3.8 04/05/2017   CL 106 04/05/2017   CO2 28 04/05/2017    Lab Results  Component Value Date   ALT 42 04/04/2017   AST 30 04/04/2017   ALKPHOS 86 04/04/2017   BILITOT 1.0 04/04/2017     Physical Exam: BP 112/72 (BP Location: Left Arm, Patient Position: Sitting, Cuff Size: Normal)   Pulse 68   Temp 98.6 F (37 C) (Oral)   Ht 5\' 9"  (1.753 m)   Wt 232 lb (105.2 kg)   SpO2 97%   BMI 34.26 kg/m  Constitutional: Pleasant,well-developed, male in no acute distress. HEENT: Normocephalic and atraumatic. Conjunctivae are normal. No scleral icterus. Neck supple.  Cardiovascular: Normal rate, regular rhythm.  Pulmonary/chest: Effort normal and breath sounds normal. No wheezing, rales or rhonchi. Abdominal: Soft, nondistended, nontender. There are no masses palpable. No hepatomegaly. Extremities: no edema Lymphadenopathy: No cervical adenopathy noted. Neurological: Alert and oriented to person place and time. Skin: Skin is warm and dry. No rashes noted. Psychiatric: Normal mood and affect. Behavior is normal.   ASSESSMENT AND PLAN: 78 year old male here for new patient assessment of the following:  Family history of colon cancer / anticoagulated - we discussed his family history, his sister died from metastatic colon cancer at age 34, she probably had at least advanced adenoma or malignancy at age 5 so would consider this a strong family history in reference to guideline recommendations for strong family history of colon cancer.  We discussed in general if he wished to have any further screening at his age.  For average risk patients without a history of polyps we often stop around age 38, however he is higher than average risk.  Other than his atrial fibrillation which is well controlled, he is on Xarelto, life expectancy would seem to reasonably be 10 years.  I offered him  colonoscopy for screening in this light if he wished to have one more prior to stopping further screening exams.  We discussed that if we were to do this we would hold his Eliquis for 2 days prior to the procedure.  The risk of stroke is increased while we do this although the overall risk is extremely low.  We discussed risk benefits of performing screening, versus the risk benefits of not performing any screening.  After this discussion he wanted to proceed with colonoscopy if his cardiologist is okay with him holding the [place].  We will reach out to his cardiologist to obtain approval, further recommendations pending the results of the exam.  He agreed  [place] Cellar, MD [facility]

## 2019-03-29 NOTE — Telephone Encounter (Signed)
Patient with diagnosis of ATRIAL FIBRILLATION on [place] for anticoagulation.    Procedure: COLONOSCOPY Date of procedure: 05-08-2019  CHADS2-VASc score of   (HTN, AGE x 2, CAD)  CrCl = 38 ml/min  Per office protocol, patient can hold XARELTO for 2 days prior to procedure.

## 2019-03-29 NOTE — Patient Instructions (Addendum)
If you are age 77 or older, your body mass index should be between 23-30. Your Body mass index is 34.26 kg/m. If this is out of the aforementioned range listed, please consider follow up with your Primary Care Provider.  If you are age 60 or younger, your body mass index should be between 19-25. Your Body mass index is 34.26 kg/m. If this is out of the aformentioned range listed, please consider follow up with your Primary Care Provider.   To help prevent the possible spread of infection to our patients, communities, and staff; we will be implementing the following measures:  As of now we are not allowing any visitors/family members to accompany you to any upcoming appointments with [facility]. If you have any concerns about this please contact our office to discuss prior to the appointment.   You have been scheduled for a colonoscopy.  Please follow written instructions given to you at your visit today.  Please pick up your prep supplies at the pharmacy within the next 1-3 days. If you use inhalers (even only as needed), please bring them with you on the day of your procedure. Your physician has requested that you go to www.startemmi.com and enter the access code given to you at your visit today. This web site gives a general overview about your procedure. However, you should still follow specific instructions given to you by our office regarding your preparation for the procedure.   You will be contacted by our office prior to your procedure for directions on holding your Xarelto.  If you do not hear from our office 1 week prior to your scheduled procedure, please call [phone] to discuss.   Thank you for entrusting me with your care and for choosing [facility], Dr. [place] Cellar

## 2019-03-29 NOTE — Telephone Encounter (Signed)
[facility] Pre-operative Risk Assessment     Request for surgical clearance:     Endoscopy Procedure  What type of surgery is being performed?     COLONOSCOPY  When is this surgery scheduled?     05-08-19  What type of clearance is required ?   Pharmacy  Are there any medications that need to be held prior to surgery and how long? [place] 2 DAYS  Practice name and name of physician performing surgery?  [facility]  What is your office phone and fax number?      Phone- [phone]  Fax- [phone] Attn: Lemar Lofty, CMA  Anesthesia type (None, local, MAC, general) ?       MAC  Thank you!!!

## 2019-04-02 NOTE — Telephone Encounter (Signed)
Called and spoke to pt. He understands to hold his Xarelto 2 days prior to procedure, starting on 05-06-19.

## 2019-04-24 ENCOUNTER — Other Ambulatory Visit: Payer: Self-pay

## 2019-04-24 ENCOUNTER — Encounter: Payer: Self-pay | Admitting: Gastroenterology

## 2019-04-24 MED ORDER — FUROSEMIDE 20 MG PO TABS: 20.0000 mg | ORAL_TABLET | Freq: Every day | ORAL | 4 refills | Status: DC

## 2019-04-24 MED ORDER — DILTIAZEM HCL ER COATED BEADS 240 MG PO CP24: 240.0000 mg | ORAL_CAPSULE | Freq: Every day | ORAL | 0 refills | Status: DC

## 2019-04-24 MED ORDER — RIVAROXABAN 20 MG PO TABS: 20.0000 mg | ORAL_TABLET | Freq: Every day | ORAL | 5 refills | Status: DC

## 2019-04-24 MED ORDER — SPIRONOLACTONE 25 MG PO TABS: 25.0000 mg | ORAL_TABLET | Freq: Every day | ORAL | 4 refills | Status: DC

## 2019-04-24 MED ORDER — METOPROLOL SUCCINATE ER 100 MG PO TB24: 100.0000 mg | ORAL_TABLET | Freq: Every day | ORAL | 4 refills | Status: DC

## 2019-04-24 NOTE — Telephone Encounter (Signed)
Age 78, weight 105kg, SCr 1.1 on 11/14/18, CrCl 83.37mL/min, afib indication, last OV Feb 2020

## 2019-05-07 ENCOUNTER — Telehealth: Payer: Self-pay

## 2019-05-07 NOTE — Telephone Encounter (Signed)
Patient called back and answered "NO" to all screening questions. °

## 2019-05-07 NOTE — Telephone Encounter (Signed)
Covid-19 screening questions   Do you now or have you had a fever in the last 14 days?  Do you have any respiratory symptoms of shortness of breath or cough now or in the last 14 days?  Do you have any family members or close contacts with diagnosed or suspected Covid-19 in the past 14 days?  Have you been tested for Covid-19 and found to be positive?       

## 2019-05-08 ENCOUNTER — Other Ambulatory Visit: Payer: Self-pay | Admitting: Gastroenterology

## 2019-05-08 ENCOUNTER — Encounter: Payer: Self-pay | Admitting: Gastroenterology

## 2019-05-08 ENCOUNTER — Other Ambulatory Visit: Payer: Self-pay

## 2019-05-08 ENCOUNTER — Ambulatory Visit (AMBULATORY_SURGERY_CENTER): Payer: Medicare Other | Admitting: Gastroenterology

## 2019-05-08 VITALS — BP 106/79 | HR 55 | Temp 97.6°F | Resp 16 | Ht 69.0 in | Wt 232.0 lb

## 2019-05-08 DIAGNOSIS — K635 Polyp of colon: Secondary | ICD-10-CM

## 2019-05-08 DIAGNOSIS — D120 Benign neoplasm of cecum: Secondary | ICD-10-CM | POA: Diagnosis not present

## 2019-05-08 DIAGNOSIS — Z800 Family history of malignant neoplasm of digestive organs: Secondary | ICD-10-CM

## 2019-05-08 DIAGNOSIS — D122 Benign neoplasm of ascending colon: Secondary | ICD-10-CM

## 2019-05-08 DIAGNOSIS — D125 Benign neoplasm of sigmoid colon: Secondary | ICD-10-CM

## 2019-05-08 DIAGNOSIS — D123 Benign neoplasm of transverse colon: Secondary | ICD-10-CM

## 2019-05-08 MED ORDER — SODIUM CHLORIDE 0.9 % IV SOLN: 500.0000 mL | Freq: Once | INTRAVENOUS | Status: DC

## 2019-05-08 NOTE — Progress Notes (Signed)
Temp check by KA/vital check by CW.  Medical and surgical history reviewed with patient.  No changes necessary.

## 2019-05-08 NOTE — Progress Notes (Signed)
Called to room to assist during endoscopic procedure.  Patient ID and intended procedure confirmed with present staff. Received instructions for my participation in the procedure from the performing physician.  

## 2019-05-08 NOTE — Patient Instructions (Signed)
YOU HAD AN ENDOSCOPIC PROCEDURE TODAY AT THE [facility]:   Refer to the procedure report that was given to you for any specific questions about what was found during the examination.  If the procedure report does not answer your questions, please call your gastroenterologist to clarify.  If you requested that your care partner not be given the details of your procedure findings, then the procedure report has been included in a sealed envelope for you to review at your convenience later.  YOU SHOULD EXPECT: Some feelings of bloating in the abdomen. Passage of more gas than usual.  Walking can help get rid of the air that was put into your GI tract during the procedure and reduce the bloating. If you had a lower endoscopy (such as a colonoscopy or flexible sigmoidoscopy) you may notice spotting of blood in your stool or on the toilet paper. If you underwent a bowel prep for your procedure, you may not have a normal bowel movement for a few days.  Please Note:  You might notice some irritation and congestion in your nose or some drainage.  This is from the oxygen used during your procedure.  There is no need for concern and it should clear up in a day or so.  SYMPTOMS TO REPORT IMMEDIATELY:   Following lower endoscopy (colonoscopy or flexible sigmoidoscopy):  Excessive amounts of blood in the stool  Significant tenderness or worsening of abdominal pains  Swelling of the abdomen that is new, acute  Fever of 100F or higher  For urgent or emergent issues, a gastroenterologist can be reached at any hour by calling [phone].   DIET:  We do recommend a small meal at first, but then you may proceed to your regular diet.  Drink plenty of fluids but you should avoid alcoholic beverages for 24 hours.  ACTIVITY:  You should plan to take it easy for the rest of today and you should NOT DRIVE or use heavy machinery until tomorrow (because of the sedation medicines used during the test).     FOLLOW UP: Our staff will call the number listed on your records 48-72 hours following your procedure to check on you and address any questions or concerns that you may have regarding the information given to you following your procedure. If we do not reach you, we will leave a message.  We will attempt to reach you two times.  During this call, we will ask if you have developed any symptoms of COVID 19. If you develop any symptoms (ie: fever, flu-like symptoms, shortness of breath, cough etc.) before then, please call [phone].  If you test positive for Covid 19 in the 2 weeks post procedure, please call and report this information to us.    If any biopsies were taken you will be contacted by phone or by letter within the next 1-3 weeks.  Please call us at [phone] if you have not heard about the biopsies in 3 weeks.    SIGNATURES/CONFIDENTIALITY: You and/or your care partner have signed paperwork which will be entered into your electronic medical record.  These signatures attest to the fact that that the information above on your After Visit Summary has been reviewed and is understood.  Full responsibility of the confidentiality of this discharge information lies with you and/or your care-partner. 

## 2019-05-08 NOTE — Op Note (Addendum)
[facility] Patient Name: Russell [name] Procedure Date: 05/08/2019 9:57 AM MRN: [id number] Endoscopist: Remo Lipps P. Havery Moros , MD Age: 78 Referring MD:  Date of Birth: August 14, 1941 Gender: Male Account #: [id number] Procedure:                Colonoscopy Indications:              Screening in patient at increased risk: Family                            history of 1st-degree relative with colorectal                            cancer (sister passed around age 85) Medicines:                Monitored Anesthesia Care Procedure:                Pre-Anesthesia Assessment:                           - Prior to the procedure, a History and Physical                            was performed, and patient medications and                            allergies were reviewed. The patient's tolerance of                            previous anesthesia was also reviewed. The risks                            and benefits of the procedure and the sedation                            options and risks were discussed with the patient.                            All questions were answered, and informed consent                            was obtained. Prior Anticoagulants: The patient has                            taken Xarelto (rivaroxaban), last dose was 2 days                            prior to procedure. After reviewing the risks and                            benefits, the patient was deemed in satisfactory                            condition to undergo the procedure.  After obtaining informed consent, the colonoscope                            was passed under direct vision. Throughout the                            procedure, the patient's blood pressure, pulse, and                            oxygen saturations were monitored continuously. The                            Colonoscope was introduced through the anus and                            advanced to the the cecum,  identified by                            appendiceal orifice and ileocecal valve. The                            colonoscopy was performed without difficulty. The                            patient tolerated the procedure well. The quality                            of the bowel preparation was adequate. The                            ileocecal valve, appendiceal orifice, and rectum                            were photographed. Scope In: 10:06:39 AM Scope Out: 10:22:54 AM Scope Withdrawal Time: 0 hours 14 minutes 8 seconds  Total Procedure Duration: 0 hours 16 minutes 15 seconds  Findings:                 The perianal and digital rectal examinations were                            normal other than skin tags.                           A diminutive polyp was found in the cecum. The                            polyp was sessile. The polyp was removed with a                            cold snare. Resection and retrieval were complete.                           A 3 mm polyp was found in the ascending colon. The  polyp was flat. The polyp was removed with a cold                            snare. Resection and retrieval were complete.                           A 4 mm polyp was found in the transverse colon. The                            polyp was sessile. The polyp was removed with a                            cold snare. Resection and retrieval were complete.                           A 3 mm polyp was found in the sigmoid colon. The                            polyp was sessile. The polyp was removed with a                            cold snare. Resection and retrieval were complete.                           Multiple small-mouthed diverticula were found in                            the sigmoid colon.                           Internal hemorrhoids were found during                            retroflexion. The hemorrhoids were moderate.                           Anal  papilla(e) were hypertrophied.                           The exam was otherwise without abnormality. Complications:            No immediate complications. Estimated blood loss:                            Minimal. Estimated Blood Loss:     Estimated blood loss was minimal. Impression:               - One diminutive polyp in the cecum, removed with a                            cold snare. Resected and retrieved.                           - One 3 mm polyp in the ascending colon, removed  with a cold snare. Resected and retrieved.                           - One 4 mm polyp in the transverse colon, removed                            with a cold snare. Resected and retrieved.                           - One 3 mm polyp in the sigmoid colon, removed with                            a cold snare. Resected and retrieved.                           - Diverticulosis in the sigmoid colon.                           - Internal hemorrhoids.                           - Anal papilla(e) were hypertrophied.                           - Perianal skin tags                           - The examination was otherwise normal. Recommendation:           - Patient has a contact number available for                            emergencies. The signs and symptoms of potential                            delayed complications were discussed with the                            patient. Return to normal activities tomorrow.                            Written discharge instructions were provided to the                            patient.                           - Resume previous diet.                           - Continue present medications.                           - Await pathology results.                           - Resume  Xarelto tomorrow Remo Lipps P. Havery Moros, MD 05/08/2019 10:27:23 AM This report has been signed electronically.

## 2019-05-08 NOTE — Progress Notes (Signed)
Report to PACU, RN, vss, BBS= Clear.  

## 2019-05-10 ENCOUNTER — Telehealth: Payer: Self-pay | Admitting: *Deleted

## 2019-05-10 ENCOUNTER — Telehealth: Payer: Self-pay

## 2019-05-10 NOTE — Telephone Encounter (Signed)
  Follow up Call-  Call back number 05/08/2019  Post procedure Call Back phone  # [phone]  Permission to leave phone message Yes  Some recent data might be hidden     Patient questions:  Do you have a fever, pain , or abdominal swelling? No. Pain Score  0 *  Have you tolerated food without any problems? Yes.    Have you been able to return to your normal activities? Yes.    Do you have any questions about your discharge instructions: Diet   No. Medications  No. Follow up visit  No.  Do you have questions or concerns about your Care? No.  Actions: * If pain score is 4 or above: No action needed, pain <4. 1. Have you developed a fever since your procedure? no  2.   Have you had an respiratory symptoms (SOB or cough) since your procedure? no  3.   Have you tested positive for COVID 19 since your procedure no  4.   Have you had any family members/close contacts diagnosed with the COVID 19 since your procedure?  no   If yes to any of these questions please route to Joylene John, RN and Alphonsa Gin, Therapist, sports.

## 2019-05-10 NOTE — Telephone Encounter (Signed)
  Follow up Call-  Call back number 05/08/2019  Post procedure Call Back phone  # [phone]  Permission to leave phone message Yes  Some recent data might be hidden     Patient questions:  Message left to call us if necessary.

## 2019-05-15 ENCOUNTER — Encounter: Payer: Self-pay | Admitting: Gastroenterology

## 2019-05-15 ENCOUNTER — Encounter: Payer: Self-pay | Admitting: Internal Medicine

## 2019-05-15 NOTE — Progress Notes (Signed)
Cardiology Office Note   Date:  05/16/2019   ID:  Russell, [name] Aug 14, 1941, MRN [id number]  PCP:  Haywood Pao, MD  Cardiologist:   Dorris Carnes, MD   Pt presents for f/u of CAD  History of Present Illness: Russell [name] is a 78 y.o. male with a history of CAD (by CT), HTN, HL and  atrial fibrillation   In 2018 he experienced SOB   Went to ED   Found to be in afib   Started anticoagulant Rx and was cardioverted   Never had palpitations Echo showed LVEF 55%  I saw the pt back Feb 2020    Since then he has done well  No palpitatiosn   No dizziness  No CP  No SOB    Walking daily      Current Meds  Medication Sig  . atorvastatin (LIPITOR) 80 MG tablet Take 1 tablet (80 mg total) by mouth daily.  Marland Kitchen diltiazem (CARDIZEM CD) 240 MG 24 hr capsule Take 1 capsule (240 mg total) by mouth daily.  . furosemide (LASIX) 20 MG tablet Take 1 tablet (20 mg total) by mouth daily.  Marland Kitchen losartan (COZAAR) 100 MG tablet TAKE ONE TABLET (100 MG) BY MOUTH EVERY MORNING -- Office visit needed for further refills  . metoprolol succinate (TOPROL-XL) 100 MG 24 hr tablet Take 1 tablet (100 mg total) by mouth daily. Take with or immediately following a meal.  . rivaroxaban (XARELTO) 20 MG TABS tablet Take 1 tablet (20 mg total) by mouth daily with breakfast.  . spironolactone (ALDACTONE) 25 MG tablet Take 1 tablet (25 mg total) by mouth daily.     Allergies:   Patient has no known allergies.   Past Medical History:  Diagnosis Date  . CAD (coronary artery disease)   . Diverticulosis 12/2012   colonoscopy  . Family hx of colon cancer    mother  . Habitual alcohol use   . History of prostate cancer    Dr.Peterson  . History of skin cancer    basal cell, Dr.Turner   . Hx of colonic polyp    Dr Sharlett Iles  . Hyperlipidemia    NMR 2011, LDL 103 (1410/120),HDL 73, TG 76. LDL goal =<130  . Hypertension   . Hypertensive encephalopathy    08/01/2009-08/03/2009    Past Surgical History:   Procedure Laterality Date  . CARDIOVERSION N/A 06/29/2017   Procedure: CARDIOVERSION;  Surgeon: Jacolyn Reedy, MD;  Location: [facility] ENDOSCOPY;  Service: Cardiovascular;  Laterality: N/A;  . CATARACT EXTRACTION  2010   OD, Dr.Digby  . COLONOSCOPY W/ POLYPECTOMY     2000, Neg [id number] & 2014. Dr.Patterson  . PROSTATECTOMY  2002   Radiation treatmens 2002-2003  . TONSILLECTOMY       Social History:  The patient  reports that he quit smoking about 40 years ago. He has never used smokeless tobacco. He reports current alcohol use of about 10.0 standard drinks of alcohol per week. He reports that he does not use drugs.   Family History:  The patient's family history includes Atrial fibrillation in his father; Colon cancer (age of onset: 37) in his mother; Heart attack (age of onset: 58) in his maternal grandfather; Pancreatic cancer in his maternal grandmother; Prostate cancer (age of onset: 59) in his brother; Stomach cancer in his paternal grandfather.    ROS:  Please see the history of present illness. All other systems are reviewed and  Negative to the above problem  except as noted.    PHYSICAL EXAM: VS:  BP 114/68   Pulse 66   Ht 5\' 9"  (1.753 m)   Wt 224 lb 6.4 oz (101.8 kg)   SpO2 97%   BMI 33.14 kg/m   GEN: Obese 78 yo  in no acute distress  HEENT: normal  Neck: no JVD, no carotid bruits Cardiac: RRR; no murmurs or gallops,no edema  Respiratory:  clear to auscultation bilaterally, normal work of breathing GI: soft, nontender, nondistended, + BS  No hepatomegaly  MS: no deformity Moving all extremities   Skin: warm and dry, no rash Neuro:  Strength and sensation are intact Psych: euthymic mood, full affect   EKG:  EKG is not ordered   Lipid Panel    Component Value Date/Time   CHOL 160 11/10/2016 0759   TRIG 92.0 11/10/2016 0759   HDL 52.20 11/10/2016 0759   CHOLHDL 3 11/10/2016 0759   VLDL 18.4 11/10/2016 0759   LDLCALC 89 11/10/2016 0759   LDLDIRECT 127.3  09/13/2011 1151      Wt Readings from Last 3 Encounters:  05/16/19 224 lb 6.4 oz (101.8 kg)  05/08/19 232 lb (105.2 kg)  03/29/19 232 lb (105.2 kg)      ASSESSMENT AND PLAN:  1  PAF  Pt denies palpitatons   Keep on same meds     2   CAD   CT with calcifications on LAD and LCx   Check lipids    LDL was 108 and Dr Osborne Casco increased statin      3   HL  Check liods today with change in meds    4   HTN   BP is good   Keep on same meds   Check labs  5   Obesity   Discussed diet and wt loss    F/U in 8 months      Current medicines are reviewed at length with the patient today.  The patient does not have concerns regarding medicines.  Signed, Dorris Carnes, MD  05/16/2019 10:27 AM    [facility] [address], [place], [place]  [address] Phone: [phone]; Fax: [phone]

## 2019-05-16 ENCOUNTER — Encounter: Payer: Self-pay | Admitting: Internal Medicine

## 2019-05-16 ENCOUNTER — Other Ambulatory Visit: Payer: Self-pay

## 2019-05-16 ENCOUNTER — Ambulatory Visit (INDEPENDENT_AMBULATORY_CARE_PROVIDER_SITE_OTHER): Payer: Medicare Other | Admitting: Internal Medicine

## 2019-05-16 ENCOUNTER — Encounter (INDEPENDENT_AMBULATORY_CARE_PROVIDER_SITE_OTHER): Payer: Self-pay

## 2019-05-16 VITALS — BP 114/68 | HR 66 | Ht 69.0 in | Wt 224.4 lb

## 2019-05-16 DIAGNOSIS — I2510 Atherosclerotic heart disease of native coronary artery without angina pectoris: Secondary | ICD-10-CM

## 2019-05-16 DIAGNOSIS — E782 Mixed hyperlipidemia: Secondary | ICD-10-CM

## 2019-05-16 DIAGNOSIS — I4891 Unspecified atrial fibrillation: Secondary | ICD-10-CM

## 2019-05-16 LAB — BASIC METABOLIC PANEL
BUN/Creatinine Ratio: 18 (ref 10–24)
BUN: 21 mg/dL (ref 8–27)
CO2: 22 mmol/L (ref 20–29)
Calcium: 9.4 mg/dL (ref 8.6–10.2)
Chloride: 101 mmol/L (ref 96–106)
Creatinine, Ser: 1.17 mg/dL (ref 0.76–1.27)
GFR calc Af Amer: 69 mL/min/{1.73_m2} (ref >59)
GFR calc non Af Amer: 60 mL/min/{1.73_m2} (ref >59)
Glucose: 94 mg/dL (ref 65–99)
Potassium: 4.8 mmol/L (ref 3.5–5.2)
Sodium: 137 mmol/L (ref 134–144)

## 2019-05-16 LAB — LIPID PANEL
Chol/HDL Ratio: 2.6 ratio (ref 0.0–5.0)
Cholesterol, Total: 164 mg/dL (ref 100–199)
HDL: 63 mg/dL (ref >39)
LDL Chol Calc (NIH): 84 mg/dL (ref 0–99)
Triglycerides: 95 mg/dL (ref 0–149)
VLDL Cholesterol Cal: 17 mg/dL (ref 5–40)

## 2019-05-16 LAB — CBC
Hematocrit: 40.3 % (ref 37.5–51.0)
Hemoglobin: 13.8 g/dL (ref 13.0–17.7)
MCH: 31.9 pg (ref 26.6–33.0)
MCHC: 34.2 g/dL (ref 31.5–35.7)
MCV: 93 fL (ref 79–97)
Platelets: 284 10*3/uL (ref 150–450)
RBC: 4.33 x10E6/uL (ref 4.14–5.80)
RDW: 12.2 % (ref 11.6–15.4)
WBC: 5.8 10*3/uL (ref 3.4–10.8)

## 2019-05-16 LAB — AST: AST: 24 IU/L (ref 0–40)

## 2019-05-16 NOTE — Patient Instructions (Addendum)
Medication Instructions:  No changes If you need a refill on your cardiac medications before your next appointment, please call your pharmacy.   Lab work: Today: cbc, bmet, lipids, ast If you have labs (blood work) drawn today and your tests are completely normal, you will receive your results only by: Marland Kitchen MyChart Message (if you have MyChart) OR . A paper copy in the mail If you have any lab test that is abnormal or we need to change your treatment, we will call you to review the results.  Testing/Procedures: none  Follow-Up: At [facility], you and your health needs are our priority.  As part of our continuing mission to provide you with exceptional heart care, we have created designated Provider Care Teams.  These Care Teams include your primary Cardiologist (physician) and Advanced Practice Providers (APPs -  Physician Assistants and Nurse Practitioners) who all work together to provide you with the care you need, when you need it. You will need a follow up appointment in:  8 months.  Please call our office 2 months in advance to schedule this appointment.  You may see Dorris Carnes, MD or one of the following Advanced Practice Providers on your designated Care Team: Richardson Dopp, PA-C [place], [place] . Daune Perch, NP  Any Other Special Instructions Will Be Listed Below (If Applicable).

## 2019-05-22 ENCOUNTER — Telehealth: Payer: Self-pay | Admitting: *Deleted

## 2019-05-22 DIAGNOSIS — I2510 Atherosclerotic heart disease of native coronary artery without angina pectoris: Secondary | ICD-10-CM

## 2019-05-22 DIAGNOSIS — E782 Mixed hyperlipidemia: Secondary | ICD-10-CM

## 2019-05-22 MED ORDER — EZETIMIBE 10 MG PO TABS: 10.0000 mg | ORAL_TABLET | Freq: Every day | ORAL | 3 refills | Status: DC

## 2019-05-22 NOTE — Telephone Encounter (Signed)
-----   Message from Fay Records, MD sent at 05/17/2019  3:20 PM EDT ----- CBC is normal  Electrolytes and liver function OK Lipids:   LDL 84  With CAD noted on CT scan goal is for LDL 70 or less I would add Zetia 10 mg (take at time of largest meal)  COntinue other meds F/U lipids in 8 wks.

## 2019-05-22 NOTE — Telephone Encounter (Signed)
Patient informed of results/recommendations from Dr. Harrington Challenger. Will add zetia daily.  Will continue atorvastatin daily. Planned next lab appointment 07/17/19. Pt appreciative for information provided.

## 2019-05-28 ENCOUNTER — Other Ambulatory Visit: Payer: Self-pay | Admitting: Internal Medicine

## 2019-07-17 ENCOUNTER — Other Ambulatory Visit: Payer: Medicare Other | Admitting: *Deleted

## 2019-07-17 ENCOUNTER — Other Ambulatory Visit: Payer: Self-pay

## 2019-07-17 DIAGNOSIS — E782 Mixed hyperlipidemia: Secondary | ICD-10-CM

## 2019-07-17 DIAGNOSIS — I2510 Atherosclerotic heart disease of native coronary artery without angina pectoris: Secondary | ICD-10-CM

## 2019-07-17 LAB — LIPID PANEL
Chol/HDL Ratio: 2 ratio (ref 0.0–5.0)
Cholesterol, Total: 125 mg/dL (ref 100–199)
HDL: 61 mg/dL (ref >39)
LDL Chol Calc (NIH): 47 mg/dL (ref 0–99)
Triglycerides: 88 mg/dL (ref 0–149)
VLDL Cholesterol Cal: 17 mg/dL (ref 5–40)

## 2019-08-18 IMAGING — CT CT ANGIO CHEST
2 of 6 series · 18 of 36 positions shown · IV contrast (Omni 300)
Comparison: Chest radiographs dated 04/03/2017

CLINICAL DATA: Shortness of breath, fatigue

EXAM:
CT ANGIOGRAPHY CHEST WITH CONTRAST
TECHNIQUE: Multidetector CT imaging of the chest was performed using the
standard protocol during bolus administration of intravenous
contrast. Multiplanar CT image reconstructions and MIPs were
obtained to evaluate the vascular anatomy.
CONTRAST:  100 mL Isovue 370 IV

[Series 7: pe thins · axial · 0.75mm/px · z∈[+25,+281]mm · 17 of 290 slices shown]
[im 17/290  lung]
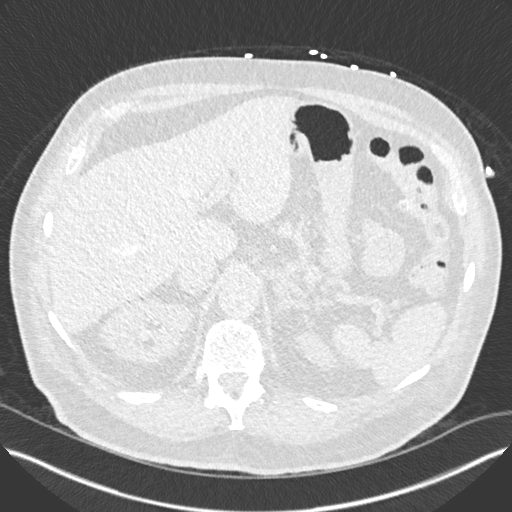
[im 33/290  mediastinal]
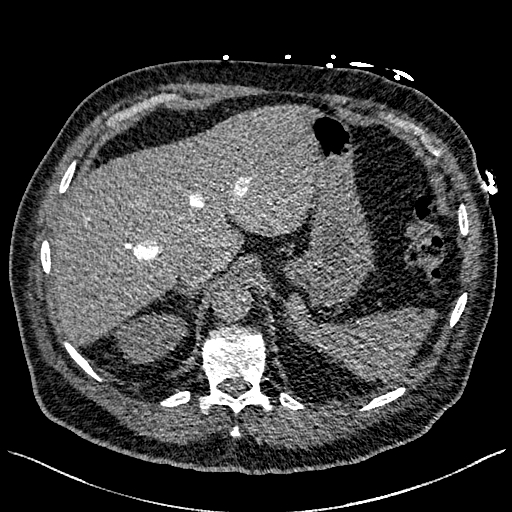
[im 49/290  lung]
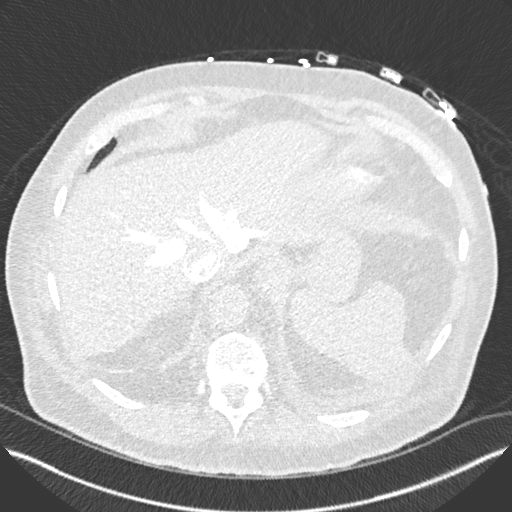
[im 65/290  mediastinal]
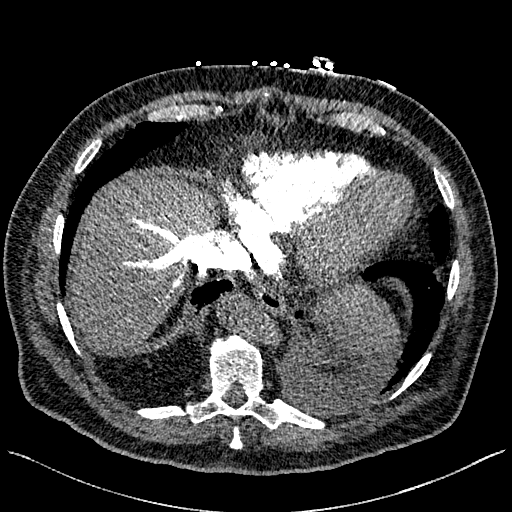
[im 81/290  lung]
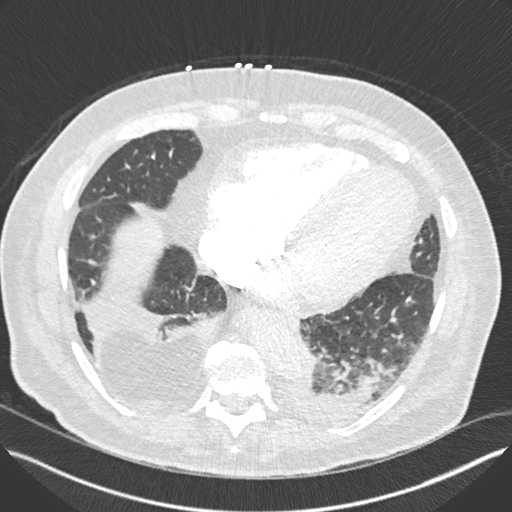
[im 97/290  mediastinal]
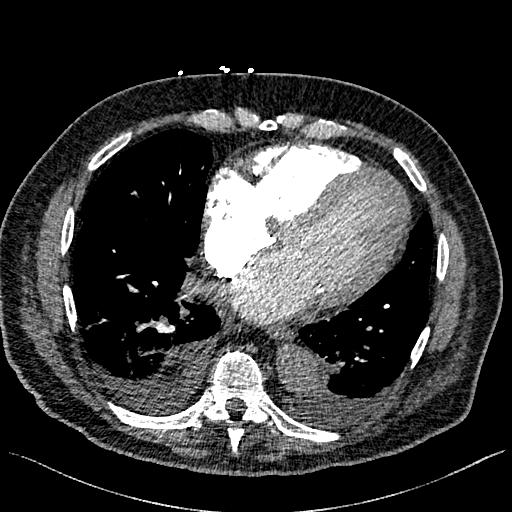
[im 113/290  lung]
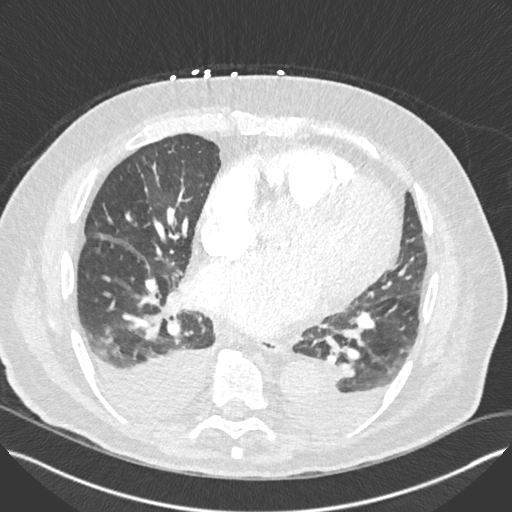
[im 129/290  mediastinal]
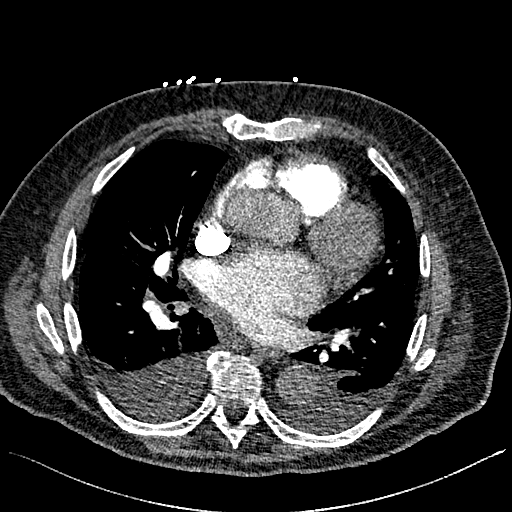
[im 145/290  lung]
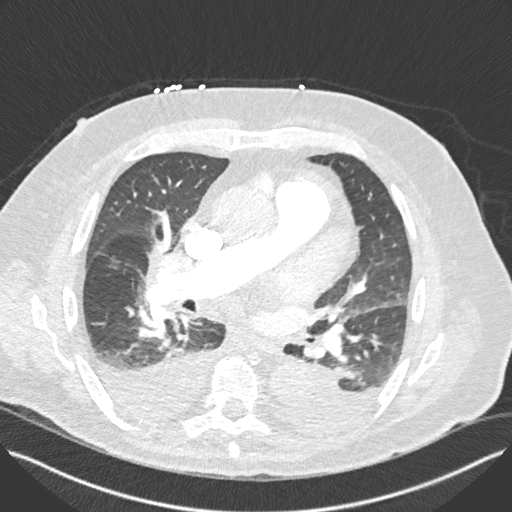
[im 161/290  mediastinal]
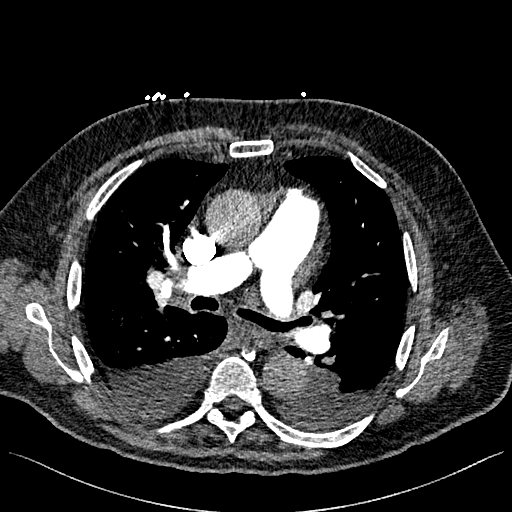
[im 177/290  lung]
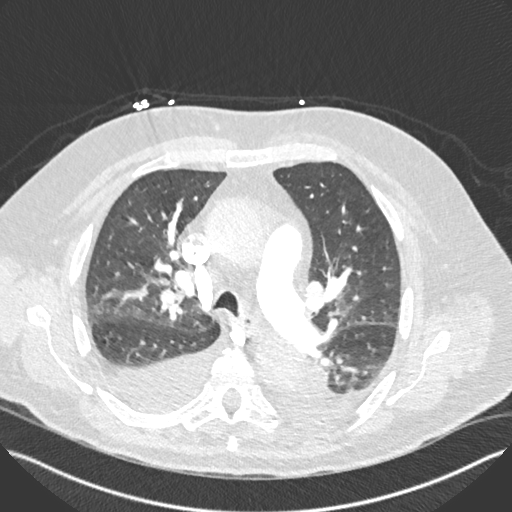
[im 193/290  mediastinal]
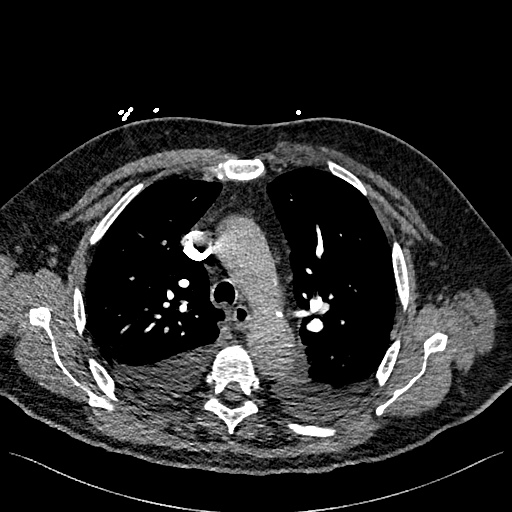
[im 209/290  lung]
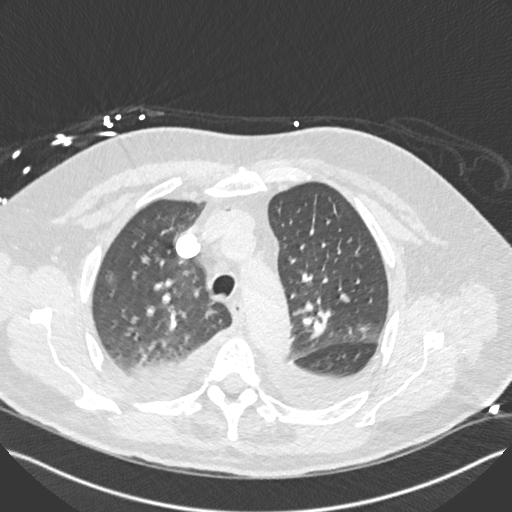
[im 225/290  mediastinal]
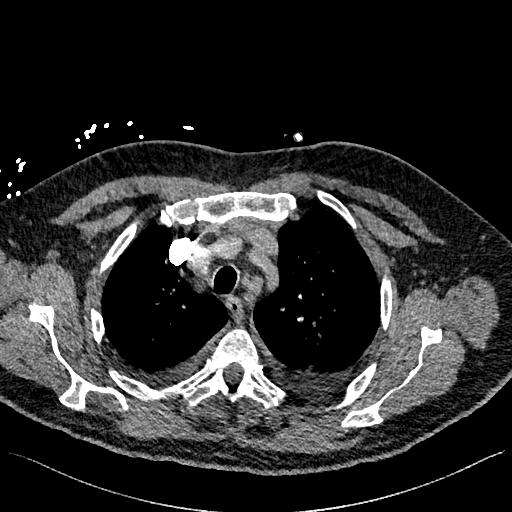
[im 241/290  lung]
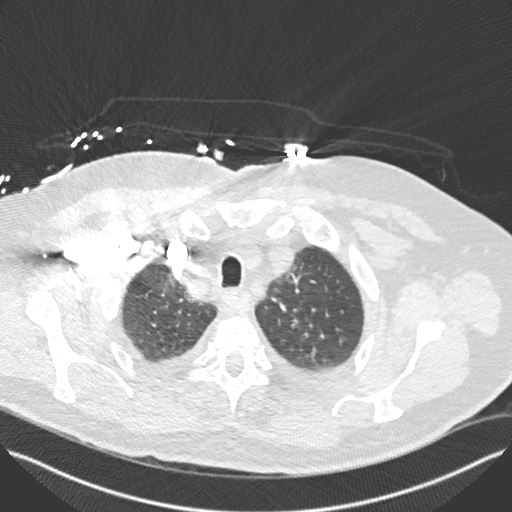
[im 257/290  mediastinal]
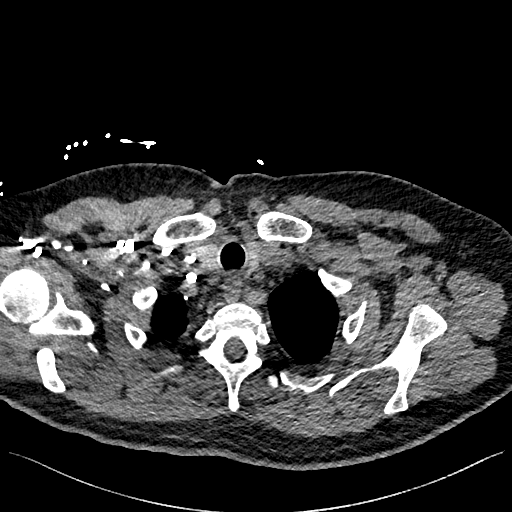
[im 273/290  lung]
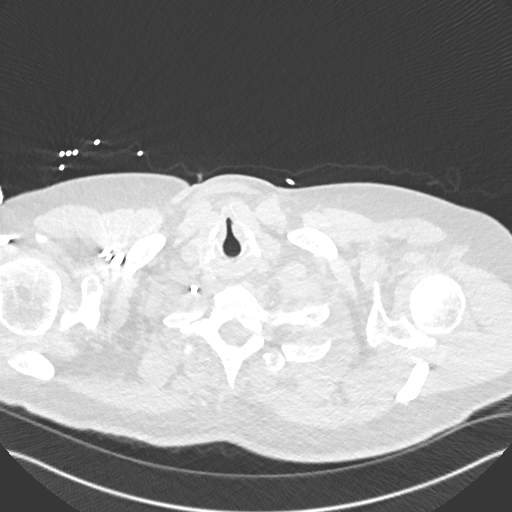

[Series 8: pe 2mm cor · coronal · 0.59mm/px · 1 of 151 slices shown]
[im 76/151  mediastinal]
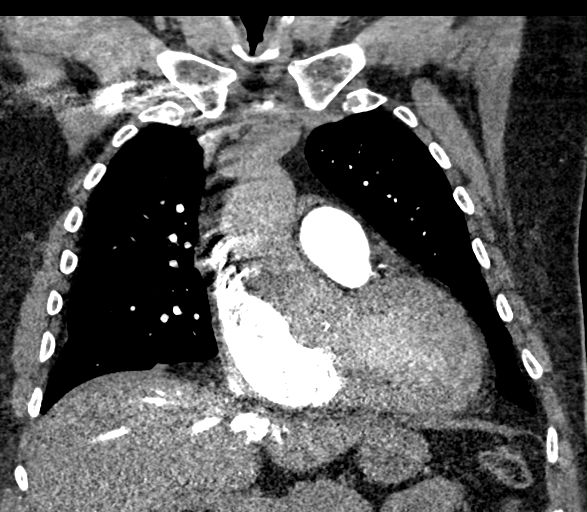

[18 of 36 positions shown; findings below may reference images not displayed]

FINDINGS: Cardiovascular: Poor opacification of the bilateral lower lobe
pulmonary arteries due to bolus timing. Otherwise, satisfactory
opacification of the bilateral pulmonary arteries to the lobar
level. No evidence of pulmonary embolism.

No evidence of thoracic aortic aneurysm. Atherosclerotic
calcifications of the aortic root/valve and aortic arch.

The heart is top-normal in size.  No pericardial effusion.

Coronary atherosclerosis of the LAD and left circumflex.

Mediastinum/Nodes: No suspicious mediastinal lymphadenopathy.

Visualized thyroid is unremarkable.

Lungs/Pleura: Evaluation of the lung parenchyma is constrained by
respiratory motion.

Mild ground-glass opacities in the lungs bilaterally, suggesting
mild interstitial edema. Associated small bilateral pleural
effusions.

No focal consolidation.

No pleural effusion or pneumothorax.

Upper Abdomen: Visualized upper abdomen is grossly unremarkable.

Musculoskeletal: Degenerative changes of the visualized
thoracolumbar spine.

Review of the MIP images confirms the above findings.
IMPRESSION: No evidence of pulmonary embolism.

Suspected mild interstitial edema. Small bilateral pleural
effusions.

Aortic Atherosclerosis ([id number]-JU5.5).

## 2019-08-18 IMAGING — DX DG CHEST 2V
2 series · 2 of 2 positions shown · non-contrast
Comparison: None.

CLINICAL DATA: Shortness of breath.

EXAM:
CHEST  2 VIEW

[chest pa]
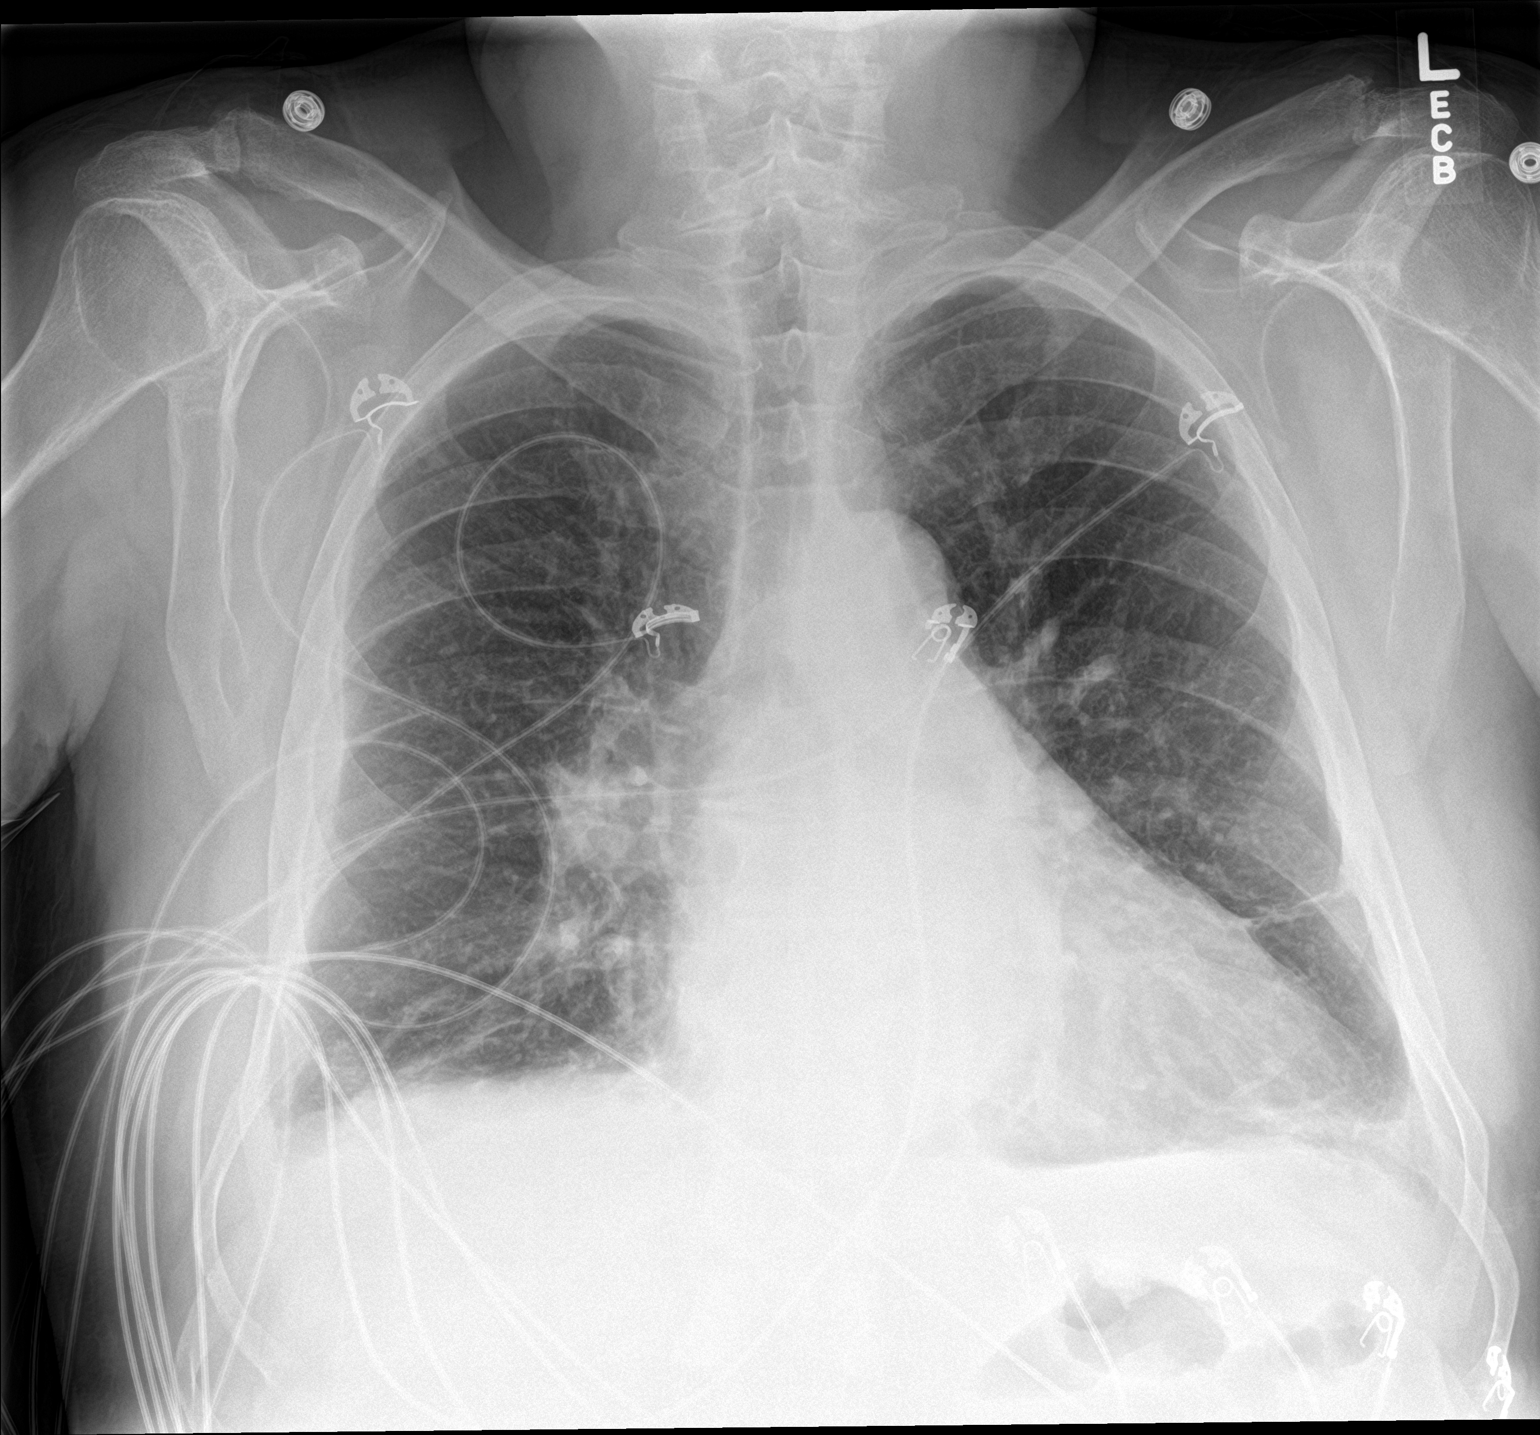

[chest lat]
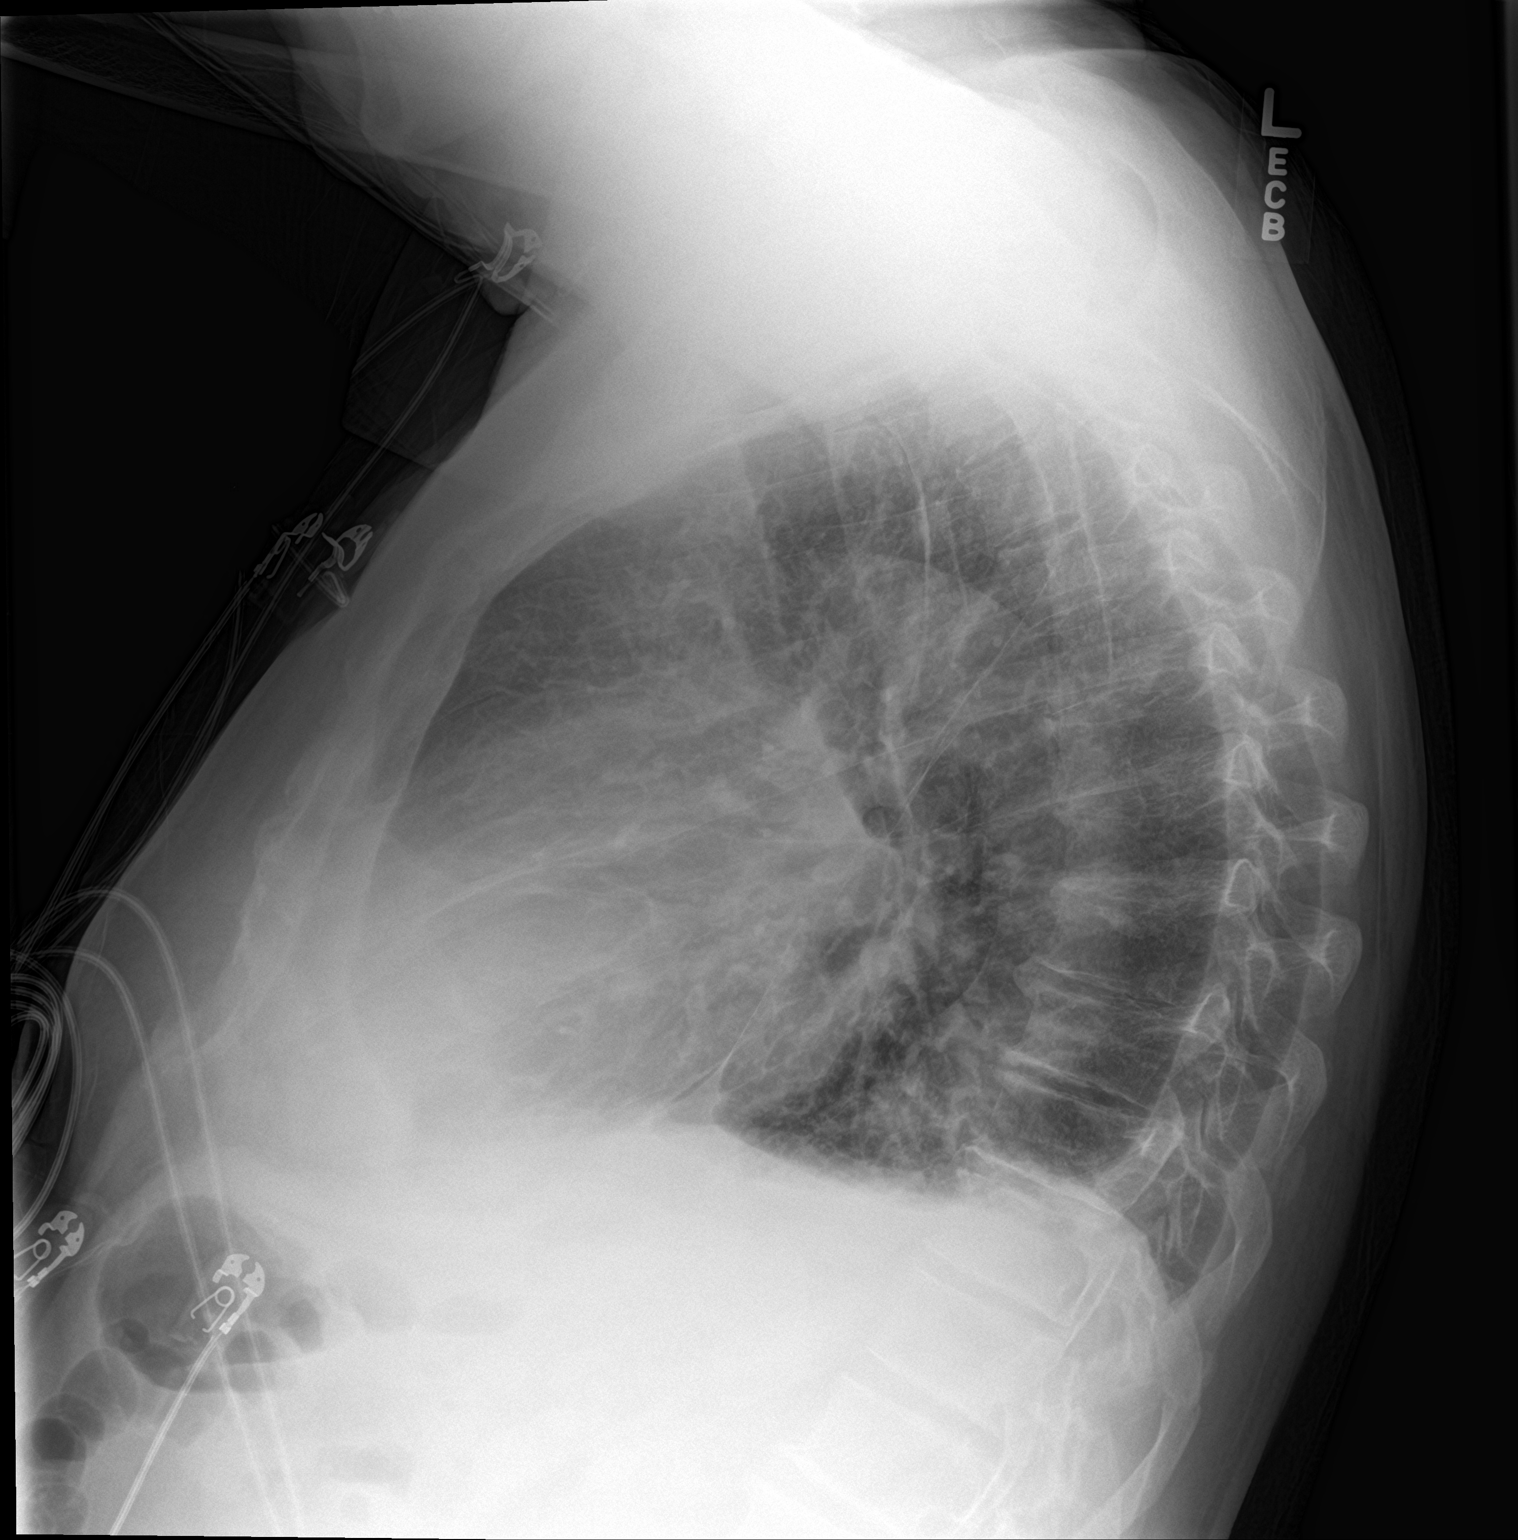

[2 of 2 positions shown; findings below may reference images not displayed]

FINDINGS: The heart size borderline. The hila and mediastinum are normal.
Small bilateral pleural effusions with underlying atelectasis. No
overt edema. No nodules or masses. Atelectasis or scar in the left
base. No other acute abnormalities.
IMPRESSION: Small bilateral effusions with underlying atelectasis.

## 2019-09-18 ENCOUNTER — Other Ambulatory Visit: Payer: Self-pay | Admitting: Internal Medicine

## 2019-10-24 ENCOUNTER — Other Ambulatory Visit: Payer: Self-pay | Admitting: Internal Medicine

## 2019-10-24 NOTE — Telephone Encounter (Signed)
Pt last saw Dr Harrington Challenger 05/16/19, last labs 05/16/19 Creat 1.17, age 79, weight 101.8kg, CrCl 76.13, based on CrCl pt is on appropriate dosage of Xarelto 20mg  QD.  Will refill rx.

## 2019-11-17 NOTE — Progress Notes (Signed)
Cardiology Office Note   Date:  11/18/2019   ID:  Russell, [name] 1941/02/04, MRN [id number]  PCP:  Russell Pao, MD  Cardiologist:   Russell Carnes, MD   Pt presents for f/u of CAD  History of Present Illness: Russell [name] is a 79 y.o. male with a history of CAD (by CT), HTN, HL and  atrial fibrillation   In 2018 he experienced SOB   Went to ED   Found to be in afib   Started anticoagulant Rx and was cardioverted   Never had palpitations Echo showed LVEF 55%  I saw the pt in Oct 2020.  Since seen he has done well.  He denies chest pain no shortness of breath (this is what he sensed when he was in atrial fibrillation in the past).  No palpitations.  No dizziness.  He walks is not working out at [employer].   Current Meds  Medication Sig  . atorvastatin (LIPITOR) 80 MG tablet Take 1 tablet (80 mg total) by mouth daily.  Marland Kitchen diltiazem (CARDIZEM CD) 240 MG 24 hr capsule Take 1 capsule (240 mg total) by mouth daily.  Marland Kitchen ezetimibe (ZETIA) 10 MG tablet Take 1 tablet (10 mg total) by mouth daily.  . furosemide (LASIX) 20 MG tablet TAKE ONE TABLET DAILY  . losartan (COZAAR) 100 MG tablet TAKE ONE TABLET (100 MG) BY MOUTH EVERY MORNING -- Office visit needed for further refills  . metoprolol succinate (TOPROL-XL) 100 MG 24 hr tablet TAKE ONE TABLET DAILY WITH OR IMMEDIATELY FOLLOWING A MEAL  . spironolactone (ALDACTONE) 25 MG tablet TAKE ONE TABLET DAILY  . XARELTO 20 MG TABS tablet TAKE ONE TABLET DAILY WITH BREAKFAST     Allergies:   Patient has no known allergies.   Past Medical History:  Diagnosis Date  . CAD (coronary artery disease)   . Diverticulosis 12/2012   colonoscopy  . Family hx of colon cancer    mother  . Habitual alcohol use   . History of prostate cancer    Dr.Peterson  . History of skin cancer    basal cell, Dr.Turner   . Hx of colonic polyp    Dr Sharlett Iles  . Hyperlipidemia    NMR 2011, LDL 103 (1410/120),HDL 73, TG 76. LDL goal =<130  .  Hypertension   . Hypertensive encephalopathy    08/01/2009-08/03/2009    Past Surgical History:  Procedure Laterality Date  . CARDIOVERSION N/A 06/29/2017   Procedure: CARDIOVERSION;  Surgeon: Jacolyn Reedy, MD;  Location: [facility] ENDOSCOPY;  Service: Cardiovascular;  Laterality: N/A;  . CATARACT EXTRACTION  2010   OD, Dr.Digby  . COLONOSCOPY W/ POLYPECTOMY     2000, Neg [id number] & 2014. Dr.Patterson  . PROSTATECTOMY  2002   Radiation treatmens 2002-2003  . TONSILLECTOMY       Social History:  The patient  reports that he quit smoking about 41 years ago. He has never used smokeless tobacco. He reports current alcohol use of about 10.0 standard drinks of alcohol per week. He reports that he does not use drugs.   Family History:  The patient's family history includes Atrial fibrillation in his father; Colon cancer (age of onset: 53) in his mother; Heart attack (age of onset: 71) in his maternal grandfather; Pancreatic cancer in his maternal grandmother; Prostate cancer (age of onset: 22) in his brother; Stomach cancer in his paternal grandfather.    ROS:  Please see the history of present illness. All other  systems are reviewed and  Negative to the above problem except as noted.    PHYSICAL EXAM: VS:  BP 122/70   Pulse (!) 59   Ht 5\' 9"  (1.753 m)   Wt 230 lb 3.2 oz (104.4 kg)   BMI 33.99 kg/m   GEN: Obese 79 yo  in no acute distress  HEENT: normal  Neck: no JVD, no carotid bruits Cardiac: RRR; no murmurs or gallops,no lower extremity edema  Respiratory:  clear to auscultation bilaterally, normal work of breathing GI: soft, nontender, nondistended, + BS  No hepatomegaly  MS: no deformity Moving all extremities   Skin: warm and dry, no rash Neuro:  Strength and sensation are intact Psych: euthymic mood, full affect   EKG:  EKG is sinus bradycardia.  59 bpm.  Right bundle branch block.  Left anterior fascicular block.  Lipid Panel    Component Value Date/Time   CHOL 125  07/17/2019 0839   TRIG 88 07/17/2019 0839   HDL 61 07/17/2019 0839   CHOLHDL 2.0 07/17/2019 0839   CHOLHDL 3 11/10/2016 0759   VLDL 18.4 11/10/2016 0759   LDLCALC 47 07/17/2019 0839   LDLDIRECT 127.3 09/13/2011 1151      Wt Readings from Last 3 Encounters:  11/18/19 230 lb 3.2 oz (104.4 kg)  05/16/19 224 lb 6.4 oz (101.8 kg)  05/08/19 232 lb (105.2 kg)      ASSESSMENT AND PLAN:  1  PAF the patient denies shortness of breath which was what he had when he was in atrial fibrillation.  He denies palpitations.  Would keep on same regimen.  Check labs  2   CAD   patient with coronary calcifications on CT.  His last lipid panel was excellent would continue.  No symptoms of angina. 3   HL last lipid panel on Zetia and Lipitor LDL was 47, HDL 61  4   HTN   good control of blood pressure.  Check labs.  Continue.    5   Obesity   Discussed diet and wt loss    F/U in November 2021.   Current medicines are reviewed at length with the patient today.  The patient does not have concerns regarding medicines.  Signed, Russell Carnes, MD  11/18/2019 9:29 AM    [facility] [address], [place], [place]  [address] Phone: [phone]; Fax: [phone]

## 2019-11-18 ENCOUNTER — Ambulatory Visit: Payer: Medicare Other | Admitting: Internal Medicine

## 2019-11-18 ENCOUNTER — Other Ambulatory Visit: Payer: Self-pay

## 2019-11-18 ENCOUNTER — Encounter: Payer: Self-pay | Admitting: Internal Medicine

## 2019-11-18 VITALS — BP 122/70 | HR 59 | Ht 69.0 in | Wt 230.2 lb

## 2019-11-18 DIAGNOSIS — E782 Mixed hyperlipidemia: Secondary | ICD-10-CM

## 2019-11-18 DIAGNOSIS — I2510 Atherosclerotic heart disease of native coronary artery without angina pectoris: Secondary | ICD-10-CM | POA: Diagnosis not present

## 2019-11-18 DIAGNOSIS — I480 Paroxysmal atrial fibrillation: Secondary | ICD-10-CM

## 2019-11-18 DIAGNOSIS — R3911 Hesitancy of micturition: Secondary | ICD-10-CM

## 2019-11-18 LAB — CBC
Hematocrit: 39.8 % (ref 37.5–51.0)
Hemoglobin: 13.7 g/dL (ref 13.0–17.7)
MCH: 31.9 pg (ref 26.6–33.0)
MCHC: 34.4 g/dL (ref 31.5–35.7)
MCV: 93 fL (ref 79–97)
Platelets: 250 10*3/uL (ref 150–450)
RBC: 4.3 x10E6/uL (ref 4.14–5.80)
RDW: 12.3 % (ref 11.6–15.4)
WBC: 5.7 10*3/uL (ref 3.4–10.8)

## 2019-11-18 LAB — BASIC METABOLIC PANEL
BUN/Creatinine Ratio: 28 — ABNORMAL HIGH (ref 10–24)
BUN: 32 mg/dL — ABNORMAL HIGH (ref 8–27)
CO2: 23 mmol/L (ref 20–29)
Calcium: 9 mg/dL (ref 8.6–10.2)
Chloride: 104 mmol/L (ref 96–106)
Creatinine, Ser: 1.14 mg/dL (ref 0.76–1.27)
GFR calc Af Amer: 71 mL/min/{1.73_m2} (ref >59)
GFR calc non Af Amer: 62 mL/min/{1.73_m2} (ref >59)
Glucose: 95 mg/dL (ref 65–99)
Potassium: 4.6 mmol/L (ref 3.5–5.2)
Sodium: 139 mmol/L (ref 134–144)

## 2019-11-18 LAB — HEPATIC FUNCTION PANEL
ALT: 22 IU/L (ref 0–44)
AST: 26 IU/L (ref 0–40)
Albumin: 4.6 g/dL (ref 3.7–4.7)
Alkaline Phosphatase: 94 IU/L (ref 39–117)
Bilirubin Total: 0.4 mg/dL (ref 0.0–1.2)
Bilirubin, Direct: 0.14 mg/dL (ref 0.00–0.40)
Total Protein: 6.9 g/dL (ref 6.0–8.5)

## 2019-11-18 LAB — TSH: TSH: 1.46 u[IU]/mL (ref 0.450–4.500)

## 2019-11-18 LAB — PSA: Prostate Specific Ag, Serum: <0.1 ng/mL (ref 0.0–4.0)

## 2019-11-18 NOTE — Patient Instructions (Signed)
Medication Instructions:  No changes *If you need a refill on your cardiac medications before your next appointment, please call your pharmacy*   Lab Work: Today: cbc, bmet, tsh, liver function, psa   Testing/Procedures: none   Follow-Up: At [facility], you and your health needs are our priority.  As part of our continuing mission to provide you with exceptional heart care, we have created designated Provider Care Teams.  These Care Teams include your primary Cardiologist (physician) and Advanced Practice Providers (APPs -  Physician Assistants and Nurse Practitioners) who all work together to provide you with the care you need, when you need it.  We recommend signing up for the patient portal called "MyChart".  Sign up information is provided on this After Visit Summary.  MyChart is used to connect with patients for Virtual Visits (Telemedicine).  Patients are able to view lab/test results, encounter notes, upcoming appointments, etc.  Non-urgent messages can be sent to your provider as well.   To learn more about what you can do with MyChart, go to [email or web address].    Your next appointment:   6-7 month(s)  The format for your next appointment:   In Person  Provider:   You may see Dorris Carnes, MD or one of the following Advanced Practice Providers on your designated Care Team:    Richardson Dopp, PA-C  Robbie Lis, [place]   Other Instructions

## 2019-11-19 ENCOUNTER — Telehealth: Payer: Self-pay | Admitting: Internal Medicine

## 2019-11-19 NOTE — Telephone Encounter (Signed)
New Message  Pt was returning phone call about results  Please call back

## 2019-11-19 NOTE — Telephone Encounter (Signed)
Patient wanted labs sent to PCP. Done.

## 2020-04-21 ENCOUNTER — Other Ambulatory Visit: Payer: Self-pay | Admitting: Internal Medicine

## 2020-04-21 NOTE — Telephone Encounter (Signed)
Prescription refill request for Xarelto received.   Last office visit: Harrington Challenger, 11/18/2019 Weight: 104.4 kg Age: 79 y.o. Scr: 1.14, 11/18/2019 CrCl: 78 ml/min   Prescription refill sent.

## 2020-05-18 ENCOUNTER — Other Ambulatory Visit: Payer: Self-pay | Admitting: Internal Medicine

## 2020-06-15 ENCOUNTER — Other Ambulatory Visit: Payer: Self-pay

## 2020-06-15 ENCOUNTER — Ambulatory Visit: Payer: Medicare Other | Admitting: Internal Medicine

## 2020-06-15 ENCOUNTER — Encounter: Payer: Self-pay | Admitting: Internal Medicine

## 2020-06-15 VITALS — BP 108/70 | HR 56 | Ht 69.0 in | Wt 231.0 lb

## 2020-06-15 DIAGNOSIS — I480 Paroxysmal atrial fibrillation: Secondary | ICD-10-CM | POA: Diagnosis not present

## 2020-06-15 DIAGNOSIS — E782 Mixed hyperlipidemia: Secondary | ICD-10-CM | POA: Diagnosis not present

## 2020-06-15 NOTE — Patient Instructions (Signed)
Medication Instructions:  No changes *If you need a refill on your cardiac medications before your next appointment, please call your pharmacy*   Lab Work: none If you have labs (blood work) drawn today and your tests are completely normal, you will receive your results only by: Marland Kitchen MyChart Message (if you have MyChart) OR . A paper copy in the mail If you have any lab test that is abnormal or we need to change your treatment, we will call you to review the results.   Testing/Procedures: none   Follow-Up: At [facility], you and your health needs are our priority.  As part of our continuing mission to provide you with exceptional heart care, we have created designated Provider Care Teams.  These Care Teams include your primary Cardiologist (physician) and Advanced Practice Providers (APPs -  Physician Assistants and Nurse Practitioners) who all work together to provide you with the care you need, when you need it.  We recommend signing up for the patient portal called "MyChart".  Sign up information is provided on this After Visit Summary.  MyChart is used to connect with patients for Virtual Visits (Telemedicine).  Patients are able to view lab/test results, encounter notes, upcoming appointments, etc.  Non-urgent messages can be sent to your provider as well.   To learn more about what you can do with MyChart, go to [email or web address].    Your next appointment:   10 month(s)  (September 2022) The format for your next appointment:   In Person  Provider:   You may see Dorris Carnes, MD or one of the following Advanced Practice Providers on your designated Care Team:    Richardson Dopp, PA-C  Robbie Lis, [place]    Other Instructions

## 2020-06-15 NOTE — Progress Notes (Signed)
Cardiology Office Note   Date:  06/15/2020   ID:  Russell [name], DOB 1941-04-13, MRN [id number]  PCP:  Haywood Pao, MD  Cardiologist:   Dorris Carnes, MD   Pt presents for f/u of CAD  History of Present Illness: Russell [name] is a 79 y.o. male with a history of CAD (by CT), HTN, HL and  atrial fibrillation   In 2018 he experienced SOB   Went to ED   Found to be in afib   Started anticoagulant Rx and was cardioverted   Never had palpitations Echo showed LVEF 55%  I saw the pt in April 2021 Since I saw him he has done OK  Denies CP  Breathing is Ok   No palpitations  NO dizziness    Current Meds  Medication Sig  . atorvastatin (LIPITOR) 80 MG tablet Take 1 tablet (80 mg total) by mouth daily.  Marland Kitchen diltiazem (CARDIZEM CD) 240 MG 24 hr capsule TAKE ONE CAPSULE EACH DAY  . ezetimibe (ZETIA) 10 MG tablet TAKE ONE TABLET DAILY IN ADDITION TO ATORVASTATIN  . furosemide (LASIX) 20 MG tablet TAKE ONE TABLET DAILY  . losartan (COZAAR) 100 MG tablet TAKE ONE TABLET (100 MG) BY MOUTH EVERY MORNING -- Office visit needed for further refills  . metoprolol succinate (TOPROL-XL) 100 MG 24 hr tablet TAKE ONE TABLET DAILY WITH OR IMMEDIATELY FOLLOWING A MEAL  . spironolactone (ALDACTONE) 25 MG tablet TAKE ONE TABLET DAILY  . XARELTO 20 MG TABS tablet TAKE ONE TABLET DAILY WITH BREAKFAST     Allergies:   Patient has no known allergies.   Past Medical History:  Diagnosis Date  . CAD (coronary artery disease)   . Diverticulosis 12/2012   colonoscopy  . Family hx of colon cancer    mother  . Habitual alcohol use   . History of prostate cancer    Dr.Peterson  . History of skin cancer    basal cell, Dr.Turner   . Hx of colonic polyp    Dr Sharlett Iles  . Hyperlipidemia    NMR 2011, LDL 103 (1410/120),HDL 73, TG 76. LDL goal =<130  . Hypertension   . Hypertensive encephalopathy    08/01/2009-08/03/2009    Past Surgical History:  Procedure Laterality Date  . CARDIOVERSION N/A  06/29/2017   Procedure: CARDIOVERSION;  Surgeon: Jacolyn Reedy, MD;  Location[facility] ENDOSCOPY;  Service: Cardiovascular;  Laterality: N/A;  . CATARACT EXTRACTION  2010   OD, Dr.Digby  . COLONOSCOPY W/ POLYPECTOMY     2000, Neg [id number] & 2014. Dr.Patterson  . PROSTATECTOMY  2002   Radiation treatmens 2002-2003  . TONSILLECTOMY       Social History:  The patient  reports that he quit smoking about 41 years ago. He has never used smokeless tobacco. He reports current alcohol use of about 10.0 standard drinks of alcohol per week. He reports that he does not use drugs.   Family History:  The patient's family history includes Atrial fibrillation in his father; Colon cancer (age of onset: 54) in his mother; Heart attack (age of onset: 59) in his maternal grandfather; Pancreatic cancer in his maternal grandmother; Prostate cancer (age of onset: 60) in his brother; Stomach cancer in his paternal grandfather.    ROS:  Please see the history of present illness. All other systems are reviewed and  Negative to the above problem except as noted.    PHYSICAL EXAM: VS:  BP 108/70   Pulse (!) 56  Ht 5\' 9"  (1.753 m)   Wt 231 lb (104.8 kg)   BMI 34.11 kg/m   GEN: Obese 79 yo  in no acute distress  HEENT: normal  Neck: no JVD, no carotid bruits Cardiac: RRR; no murmurs or gallops,Tr lower extremity edema  Respiratory:  clear to auscultation bilaterally, no wheezes  GI: soft, nontender, nondistended, + BS  No hepatomegaly  MS: no deformity Moving all extremities   Skin: warm and dry, no rash Neuro:  Strength and sensation are intact Psych: euthymic mood, full affect   EKG:  EKG is sinus bradycardia 56 bpm   Incomp RBBB  Lipid Panel    Component Value Date/Time   CHOL 125 07/17/2019 0839   TRIG 88 07/17/2019 0839   HDL 61 07/17/2019 0839   CHOLHDL 2.0 07/17/2019 0839   CHOLHDL 3 11/10/2016 0759   VLDL 18.4 11/10/2016 0759   LDLCALC 47 07/17/2019 0839   LDLDIRECT 127.3 09/13/2011  1151      Wt Readings from Last 3 Encounters:  06/15/20 231 lb (104.8 kg)  11/18/19 230 lb 3.2 oz (104.4 kg)  05/16/19 224 lb 6.4 oz (101.8 kg)      ASSESSMENT AND PLAN:  1  PAF Pt remains asymptomatic   Keep on same meds   Will gat labs soon with Dr Osborne Casco  2   CAD   patient with coronary calcifications on CT.  No symptoms to sugg angina   3   HL last lipid panel on Zetia and Lipitor Lpids at [facility]'s office    4   HTN   Excellent control  COntinue current regimen  5   Obesity   Discussed diet and wt loss    F/U in Sept 2022     Current medicines are reviewed at length with the patient today.  The patient does not have concerns regarding medicines.  Signed, Dorris Carnes, MD  06/15/2020 3:11 PM    [facility] [address], [place], [place]  [address] Phone: [phone]; Fax: [phone]

## 2020-07-08 ENCOUNTER — Ambulatory Visit: Payer: Medicare Other

## 2020-07-24 ENCOUNTER — Telehealth: Payer: Self-pay | Admitting: Internal Medicine

## 2020-07-24 MED ORDER — RIVAROXABAN 20 MG PO TABS: ORAL_TABLET | ORAL | 1 refills | Status: DC

## 2020-07-24 NOTE — Telephone Encounter (Signed)
Pt called in and stated that Dr Harrington Challenger was suppose to increase his script for Xarelto to a 90 day supply   [place] number [phone]

## 2020-07-24 NOTE — Telephone Encounter (Signed)
Xarelto 20mg  refill request received as pt is requesting a 90 day supply. Pt is 79 years old, weight-104.8kg, Crea-1.14 on 11/18/2019, last seen by Dr. Harrington Challenger on 06/15/2020, Diagnosis-Afib, CrCl-79.34ml/min; Dose is appropriate based on dosing criteria. Will send in refill to requested pharmacy.

## 2020-08-17 ENCOUNTER — Other Ambulatory Visit: Payer: Self-pay | Admitting: Internal Medicine

## 2021-02-24 ENCOUNTER — Other Ambulatory Visit: Payer: Self-pay | Admitting: Internal Medicine

## 2021-02-24 NOTE — Telephone Encounter (Signed)
Prescription refill request for Xarelto received.  Indication: afib  Last office visit: Russell [name], 06/15/2020 Weight: 104.8 kg  Age: 80 yo  Scr: 1.14, 11/18/2019 CrCl: 77 ml/min   Pt is on the correct dose of Xarelto per dosing criteria, prescription refill sent for Xarelto 20mg  daily.

## 2021-06-01 ENCOUNTER — Other Ambulatory Visit: Payer: Self-pay | Admitting: Internal Medicine

## 2021-06-03 ENCOUNTER — Other Ambulatory Visit: Payer: Self-pay | Admitting: Internal Medicine

## 2021-06-15 ENCOUNTER — Encounter: Payer: Self-pay | Admitting: Internal Medicine

## 2021-06-15 ENCOUNTER — Other Ambulatory Visit: Payer: Self-pay

## 2021-06-15 ENCOUNTER — Ambulatory Visit: Payer: Medicare Other | Admitting: Internal Medicine

## 2021-06-15 VITALS — BP 108/64 | HR 64 | Ht 69.0 in | Wt 224.4 lb

## 2021-06-15 DIAGNOSIS — I480 Paroxysmal atrial fibrillation: Secondary | ICD-10-CM

## 2021-06-15 LAB — CBC
Hematocrit: 39.8 % (ref 37.5–51.0)
Hemoglobin: 13.5 g/dL (ref 13.0–17.7)
MCH: 32 pg (ref 26.6–33.0)
MCHC: 33.9 g/dL (ref 31.5–35.7)
MCV: 94 fL (ref 79–97)
Platelets: 277 10*3/uL (ref 150–450)
RBC: 4.22 x10E6/uL (ref 4.14–5.80)
RDW: 12.5 % (ref 11.6–15.4)
WBC: 5.8 10*3/uL (ref 3.4–10.8)

## 2021-06-15 LAB — BASIC METABOLIC PANEL
BUN/Creatinine Ratio: 19 (ref 10–24)
BUN: 21 mg/dL (ref 8–27)
CO2: 22 mmol/L (ref 20–29)
Calcium: 9 mg/dL (ref 8.6–10.2)
Chloride: 102 mmol/L (ref 96–106)
Creatinine, Ser: 1.13 mg/dL (ref 0.76–1.27)
Glucose: 77 mg/dL (ref 70–99)
Potassium: 4.8 mmol/L (ref 3.5–5.2)
Sodium: 138 mmol/L (ref 134–144)
eGFR: 66 mL/min/{1.73_m2} (ref >59)

## 2021-06-15 NOTE — Patient Instructions (Signed)
Medication Instructions:  Your physician recommends that you continue on your current medications as directed. Please refer to the Current Medication list given to you today.  *If you need a refill on your cardiac medications before your next appointment, please call your pharmacy*   Lab Work: TODAY: BMET, CBC  If you have labs (blood work) drawn today and your tests are completely normal, you will receive your results only by: [place] (if you have MyChart) OR A paper copy in the mail If you have any lab test that is abnormal or we need to change your treatment, we will call you to review the results.   Follow-Up: At [facility], you and your health needs are our priority.  As part of our continuing mission to provide you with exceptional heart care, we have created designated Provider Care Teams.  These Care Teams include your primary Cardiologist (physician) and Advanced Practice Providers (APPs -  Physician Assistants and Nurse Practitioners) who all work together to provide you with the care you need, when you need it.   Your next appointment:   9 month(s)  The format for your next appointment:   In Person  Provider:   You may see Dorris Carnes, MD or one of the following Advanced Practice Providers on your designated Care Team:   Richardson Dopp, PA-C [place], [place]

## 2021-06-15 NOTE — Progress Notes (Signed)
Cardiology Office Note   Date:  06/15/2021   ID:  Russell [name], DOB [birth year], MRN [id number]  PCP:  Haywood Pao, MD  Cardiologist:   Dorris Carnes, MD   Pt presents for f/u of CAD  History of Present Illness: Russell [name] is a 80 y.o. male with a history of CAD (by CT), HTN, HL and  atrial fibrillation   In 2018 he experienced SOB   Went to ED   Found to be in afib   Started anticoagulant Rx and was cardioverted   Never had palpitations Echo showed LVEF 55%  I saw the pt in April 2021 Since I saw him he has done OK  Denies CP  Breathing is Ok   He denies palpitations  NO dizziness    Walks regularly in the morning   2.5 miles   Current Meds  Medication Sig   atorvastatin (LIPITOR) 80 MG tablet Take 1 tablet (80 mg total) by mouth daily.   diltiazem (CARDIZEM CD) 240 MG 24 hr capsule TAKE ONE CAPSULE EACH DAY   ezetimibe (ZETIA) 10 MG tablet TAKE ONE TABLET DAILY IN ADDITION TO ATORVASTATIN   furosemide (LASIX) 20 MG tablet TAKE ONE TABLET DAILY   losartan (COZAAR) 100 MG tablet TAKE ONE TABLET (100 MG) BY MOUTH EVERY MORNING -- Office visit needed for further refills   metoprolol succinate (TOPROL-XL) 100 MG 24 hr tablet TAKE ONE TABLET DAILY WITH OR IMMEDIATELY FOLLOWING A MEAL   spironolactone (ALDACTONE) 25 MG tablet TAKE ONE TABLET DAILY   XARELTO 20 MG TABS tablet TAKE ONE TABLET DAILY WITH BREAKFAST     Allergies:   Patient has no known allergies.   Past Medical History:  Diagnosis Date   CAD (coronary artery disease)    Diverticulosis 12/2012   colonoscopy   Family hx of colon cancer    mother   Habitual alcohol use    History of prostate cancer    Dr.Peterson   History of skin cancer    basal cell, Dr.Turner    Hx of colonic polyp    Dr Sharlett Iles   Hyperlipidemia    NMR 2011, LDL 103 (1410/120),HDL 73, TG 76. LDL goal =<130   Hypertension    Hypertensive encephalopathy    08/01/2009-08/03/2009    Past Surgical History:  Procedure  Laterality Date   CARDIOVERSION N/A 06/29/2017   Procedure: CARDIOVERSION;  Surgeon: Jacolyn Reedy, MD;  Location: [facility] ENDOSCOPY;  Service: Cardiovascular;  Laterality: N/A;   CATARACT EXTRACTION  2010   OD, Dr.Digby   COLONOSCOPY W/ POLYPECTOMY     2000, Neg [id number] & 2014. Dr.Patterson   PROSTATECTOMY  2002   Radiation treatmens 2002-2003   TONSILLECTOMY       Social History:  The patient  reports that he quit smoking about 42 years ago. His smoking use included cigarettes. He has never used smokeless tobacco. He reports current alcohol use of about 10.0 standard drinks per week. He reports that he does not use drugs.   Family History:  The patient's family history includes Atrial fibrillation in his father; Colon cancer (age of onset: 38) in his mother; Heart attack (age of onset: 66) in his maternal grandfather; Pancreatic cancer in his maternal grandmother; Prostate cancer (age of onset: 63) in his brother; Stomach cancer in his paternal grandfather.    ROS:  Please see the history of present illness. All other systems are reviewed and  Negative to the above problem except as noted.  PHYSICAL EXAM: VS:  BP 108/64   Pulse 64   Ht 5\' 9"  (1.753 m)   Wt 224 lb 6.4 oz (101.8 kg)   SpO2 95%   BMI 33.14 kg/m   GEN: Obese 80 yo  in no acute distress  HEENT: normal  Neck: no JVD, no carotid bruits Cardiac: RRR; no murmurs or gallops,Triv lower extremity edema  Respiratory:  clear to auscultation bilaterally, no wheezes  GI: soft, nontender, nondistended, + BS  No hepatomegaly  MS: no deformity Moving all extremities   Skin: warm and dry, no rash Neuro:  Strength and sensation are intact Psych: euthymic mood, full affect   EKG:  EKG shows   SR 65 bpm   RBBB.   LAFB     Lipid Panel    Component Value Date/Time   CHOL 125 07/17/2019 0839   TRIG 88 07/17/2019 0839   HDL 61 07/17/2019 0839   CHOLHDL 2.0 07/17/2019 0839   CHOLHDL 3 11/10/2016 0759   VLDL 18.4  11/10/2016 0759   LDLCALC 47 07/17/2019 0839   LDLDIRECT 127.3 09/13/2011 1151      Wt Readings from Last 3 Encounters:  06/15/21 224 lb 6.4 oz (101.8 kg)  06/15/20 231 lb (104.8 kg)  11/18/19 230 lb 3.2 oz (104.4 kg)      ASSESSMENT AND PLAN:  1  PAF Pt denies symptoms of palpitations   Remains in SR   Keep on anticoagulant  Check CBC and BMET    2   CAD   patient with coronary calcifications on CT. No symptoms of angina   3   HL last lipid panel on Zetia and Lipitor  Periodic lipids   Discussed diet     4   HTN   Excellent  He denies dizziness   Keep on same meds   5   Obesity   Discussed diet and wt loss  Limt sweets, carbs   Needs fiber   WIll check on other sources beside all bran which has sugar.   Watch portiions    F/U in Aug 2023      Current medicines are reviewed at length with the patient today.  The patient does not have concerns regarding medicines.  Signed, Dorris Carnes, MD  06/15/2021 9:50 AM    [facility] [address], [place], [place]  [address] Phone: [phone]; Fax: [phone]

## 2021-09-13 ENCOUNTER — Other Ambulatory Visit: Payer: Self-pay | Admitting: Internal Medicine

## 2021-09-13 NOTE — Telephone Encounter (Signed)
Prescription refill request for Xarelto received.  Indication: Afib  Last office visit: 06/15/21 Harrington Challenger)  Weight: 101.8kg Age: 81 Scr:  1.13 (06/15/21)  CrCl: 76.37ml/min  Appropriate dose and refill sent to requested pharmacy.

## 2022-01-13 ENCOUNTER — Other Ambulatory Visit: Payer: Self-pay | Admitting: Internal Medicine

## 2022-01-13 NOTE — Telephone Encounter (Signed)
Pt last saw Dr Harrington Challenger 06/15/21, last labs 06/15/21 Creat 1.13, age 81, weight 101.8kg, CrCl 76.32, based on CrCl pt is on appropriate dosage of Xarelto '20mg'$  QD for afib.  Will refill rx.

## 2022-02-28 ENCOUNTER — Other Ambulatory Visit: Payer: Self-pay | Admitting: Internal Medicine

## 2022-03-07 ENCOUNTER — Encounter: Payer: Self-pay | Admitting: Gastroenterology

## 2022-03-14 ENCOUNTER — Other Ambulatory Visit: Payer: Self-pay | Admitting: Internal Medicine

## 2022-05-05 ENCOUNTER — Telehealth: Payer: Self-pay

## 2022-05-05 NOTE — Telephone Encounter (Addendum)
-----   Message from Yetta Flock, MD sent at 05/05/2022  4:07 PM EDT ----- Regarding: RE: OV / surveillance colonoscopy Thanks John.  Got it, we will certainly get him in for a visit.  Aneth Schlagel can you coordinate an office visit for this patient with me or APP. Need to see him first given his age and anticoagulant use. Thanks  Richardson Landry

## 2022-05-05 NOTE — Telephone Encounter (Signed)
Called and spoke with patient. He has been scheduled for an office visit with Dr. Havery Moros on Thursday, 06/09/22 at 1:40 pm. Pt is aware that I will mail his appt information. Pt confirmed address on file. Pt verbalized understanding and had no concerns at the end of the call.

## 2022-05-28 ENCOUNTER — Telehealth: Payer: Self-pay | Admitting: Internal Medicine

## 2022-05-28 NOTE — Telephone Encounter (Signed)
Called patient   Russell [name] from [employer] that he may be in afib  He says he felt his heart beat erratically several days ago Not dizzy  REcomm: Cut losartan in 1/2 Cut aldactone in 1/2    He does not have BP cuff Take it easy   He does not have BP cuff Call if gets dizzy or things change   Will see him on Monday  At office  Possible mid day

## 2022-05-30 ENCOUNTER — Other Ambulatory Visit: Payer: Self-pay

## 2022-05-30 ENCOUNTER — Ambulatory Visit: Payer: Medicare Other | Attending: Internal Medicine | Admitting: Internal Medicine

## 2022-05-30 ENCOUNTER — Encounter: Payer: Self-pay | Admitting: Internal Medicine

## 2022-05-30 VITALS — BP 110/80 | HR 80 | Wt 225.0 lb

## 2022-05-30 DIAGNOSIS — O10019 Pre-existing essential hypertension complicating pregnancy, unspecified trimester: Secondary | ICD-10-CM

## 2022-05-30 DIAGNOSIS — Z01810 Encounter for preprocedural cardiovascular examination: Secondary | ICD-10-CM

## 2022-05-30 DIAGNOSIS — I480 Paroxysmal atrial fibrillation: Secondary | ICD-10-CM

## 2022-05-30 DIAGNOSIS — I10 Essential (primary) hypertension: Secondary | ICD-10-CM | POA: Diagnosis not present

## 2022-05-30 DIAGNOSIS — E782 Mixed hyperlipidemia: Secondary | ICD-10-CM

## 2022-05-30 MED ORDER — SPIRONOLACTONE 25 MG PO TABS: 12.5000 mg | ORAL_TABLET | Freq: Every day | ORAL | 1 refills | Status: DC

## 2022-05-30 MED ORDER — LOSARTAN POTASSIUM 100 MG PO TABS: 50.0000 mg | ORAL_TABLET | Freq: Every day | ORAL | 0 refills | Status: DC

## 2022-05-30 NOTE — Telephone Encounter (Signed)
Left a message for the pt to call back so I can set him up to see Dr Harrington Challenger today on the office.

## 2022-05-30 NOTE — Patient Instructions (Addendum)
Medication Instructions:  Stop Aldactone and Losartan and Lasix   *If you need a refill on your cardiac medications before your next appointment, please call your pharmacy*   Lab Work: BMET, CBC, TSH, NMR, APO B, LIPO A ON TUESDAY 05/31/22  If you have labs (blood work) drawn today and your tests are completely normal, you will receive your results only by: [place] (if you have MyChart) OR A paper copy in the mail If you have any lab test that is abnormal or we need to change your treatment, we will call you to review the results.   Testing/Procedures: Your physician has requested that you have a TEE/Cardioversion. During a TEE, sound waves are used to create images of your heart. It provides your doctor with information about the size and shape of your heart and how well your heart's chambers and valves are working. In this test, a transducer is attached to the end of a flexible tube that is guided down you throat and into your esophagus (the tube leading from your mouth to your stomach) to get a more detailed image of your heart. Once the TEE has determined that a blood clot is not present, the cardioversion begins. Electrical Cardioversion uses a jolt of electricity to your heart either through paddles or wired patches attached to your chest. This is a controlled, usually prescheduled, procedure. This procedure is done at the hospital and you are not awake during the procedure. You usually go home the day of the procedure. Please see the instruction sheet given to you today for more information.     Follow-Up: At [facility], you and your health needs are our priority.  As part of our continuing mission to provide you with exceptional heart care, we have created designated Provider Care Teams.  These Care Teams include your primary Cardiologist (physician) and Advanced Practice Providers (APPs -  Physician Assistants and Nurse Practitioners) who all work together to provide  you with the care you need, when you need it.  We recommend signing up for the patient portal called "MyChart".  Sign up information is provided on this After Visit Summary.  MyChart is used to connect with patients for Virtual Visits (Telemedicine).  Patients are able to view lab/test results, encounter notes, upcoming appointments, etc.  Non-urgent messages can be sent to your provider as well.   To learn more about what you can do with MyChart, go to [email or web address].    Your next appointment:   4 week(s)  The format for your next appointment:   In Person  Provider:   Dorris Carnes, MD     Other Instructions   Important Information About Sugar

## 2022-05-30 NOTE — Addendum Note (Signed)
Addended by: Stephani Police on: 05/30/2022 02:58 PM   Modules accepted: Orders

## 2022-05-30 NOTE — Progress Notes (Signed)
Cardiology Office Note   Date:  05/30/2022   ID:  Russell [name], DOB 02-07-1941, MRN [id number]  PCP:  Haywood Pao, MD  Cardiologist:   Dorris Carnes, MD   [name] presents for f/u of CAD  History of Present Illness: Russell [name] is a 81 y.o. male with a history of CAD (by CT), HTN, HL and  atrial fibrillation   In 2018 he experienced SOB   Went to ED   Found to be in afib   Started anticoagulant Rx and was cardioverted   Never had palpitations Echo showed LVEF 55%  I last saw Russell [name] in Nov 2022  Russell [name] [place]   Didn't feel right one night   Checked Apple watch and it said he was in afib    He denies dizziness.  No CP   Says he just "doesn't feel right"  Currently not dizzy  No palpitations   Current Meds  Medication Sig   atorvastatin (LIPITOR) 80 MG tablet Take 1 tablet (80 mg total) by mouth daily.   diltiazem (CARDIZEM CD) 240 MG 24 hr capsule TAKE ONE CAPSULE EACH DAY   ezetimibe (ZETIA) 10 MG tablet TAKE ONE TABLET DAILY IN ADDITION TO ATORVASTATIN   metoprolol succinate (TOPROL-XL) 100 MG 24 hr tablet TAKE ONE TABLET DAILY WITH OR IMMEDIATELY FOLLOWING A MEAL   XARELTO 20 MG TABS tablet TAKE ONE TABLET DAILY WITH BREAKFAST   [DISCONTINUED] furosemide (LASIX) 20 MG tablet TAKE ONE TABLET DAILY   [DISCONTINUED] losartan (COZAAR) 100 MG tablet Take 0.5 tablets (50 mg total) by mouth daily. -- Office visit needed for further refills   [DISCONTINUED] spironolactone (ALDACTONE) 25 MG tablet Take 0.5 tablets (12.5 mg total) by mouth daily. (Patient taking differently: Take 25 mg by mouth daily.)     Allergies:   Patient has no known allergies.   Past Medical History:  Diagnosis Date   CAD (coronary artery disease)    Diverticulosis 12/2012   colonoscopy   Family hx of colon cancer    mother   Habitual alcohol use    History of prostate cancer    Dr.Peterson   History of skin cancer    basal cell, Dr.Turner    Hx of colonic  polyp    Dr Sharlett Iles   Hyperlipidemia    NMR 2011, LDL 103 (1410/120),HDL 73, TG 76. LDL goal =<130   Hypertension    Hypertensive encephalopathy    08/01/2009-08/03/2009    Past Surgical History:  Procedure Laterality Date   CARDIOVERSION N/A 06/29/2017   Procedure: CARDIOVERSION;  Surgeon: Jacolyn Reedy, MD;  Location: [facility] ENDOSCOPY;  Service: Cardiovascular;  Laterality: N/A;   CATARACT EXTRACTION  2010   OD, Dr.Digby   COLONOSCOPY W/ POLYPECTOMY     2000, Neg [id number] & 2014. Dr.Patterson   PROSTATECTOMY  2002   Radiation treatmens 2002-2003   TONSILLECTOMY       Social History:  Russell patient  reports that he quit smoking about 43 years ago. His smoking use included cigarettes. He has never used smokeless tobacco. He reports current alcohol use of about 10.0 standard drinks of alcohol per week. He reports that he does not use drugs.   Family History:  Russell patient's family history includes Atrial fibrillation in his father; Colon cancer (age of onset: 85) in his mother; Heart attack (age of onset: 48) in his maternal grandfather; Pancreatic cancer in his maternal grandmother; Prostate cancer (  age of onset: 66) in his brother; Stomach cancer in his paternal grandfather.    ROS:  Please see Russell history of present illness. All other systems are reviewed and  Negative to Russell above problem except as noted.    PHYSICAL EXAM: VS:  BP 110/80 (BP Location: Left Arm, Patient Position: Supine, Cuff Size: Normal)   Pulse 80   Wt 225 lb (102.1 kg)   SpO2 96%   BMI 33.23 kg/m   GEN: Obese 81 yo  in no acute distress  HEENT: normal  Neck: no JVD, no carotid bruits Cardiac: RRR; no murmurs or gallops,Triv lower extremity edema  Respiratory:  clear to auscultation bilaterally, no wheezes  GI: soft, nontender, nondistended, + BS  No hepatomegaly  MS: no deformity Moving all extremities   Skin: warm and dry, no rash Neuro:  Strength and sensation are intact Psych: euthymic mood,  full affect   EKG:  EKG shows   Afib   82 bpm   RBBB   Lipid Panel    Component Value Date/Time   CHOL 125 07/17/2019 0839   TRIG 88 07/17/2019 0839   HDL 61 07/17/2019 0839   CHOLHDL 2.0 07/17/2019 0839   CHOLHDL 3 11/10/2016 0759   VLDL 18.4 11/10/2016 0759   LDLCALC 47 07/17/2019 0839   LDLDIRECT 127.3 09/13/2011 1151      Wt Readings from Last 3 Encounters:  05/30/22 225 lb (102.1 kg)  06/15/21 224 lb 6.4 oz (101.8 kg)  06/15/20 231 lb (104.8 kg)      ASSESSMENT AND PLAN:  1  PAF [name] is back in afib    Rates OK     Unfortunately he missed a Xarelto dose last week     Concern, with trip  ? Could have misse more  Will recomm setting up for TEE / DCCV      Labs today   2   CAD   patient with coronary calcifications on CT  [name] without angina   Risk factor modify    3   HL last lipid panel on Zetia and Lipitor  Last year was excellent control  WIll get today   4   HTN  BP 105/ On 1/2 losartan; taking other meds   Will stop losartan, stop lasix, stop spironolactone   until cardioversion    5   Obesity   Discussseed diet    Limit sugars   F/U a few wks after cardioverstion to confirm OK Then in 1 year    Current medicines are reviewed at length with Russell patient today.  Russell patient does not have concerns regarding medicines.  Signed, Dorris Carnes, MD  05/30/2022 1:12 PM    [facility] [place], [place], [place]  [address] Phone: [phone]; Fax: [phone]

## 2022-05-30 NOTE — H&P (View-Only) (Signed)
Cardiology Office Note   Date:  05/30/2022   ID:  Russell [name], DOB [birth year], MRN [id number]  PCP:  Haywood Pao, MD  Cardiologist:   Dorris Carnes, MD   Pt presents for f/u of CAD  History of Present Illness: Russell [name] is a 81 y.o. male with a history of CAD (by CT), HTN, HL and  atrial fibrillation   In 2018 he experienced SOB   Went to ED   Found to be in afib   Started anticoagulant Rx and was cardioverted   Never had palpitations Echo showed LVEF 55%  I last saw the pt in Nov 2022  [place]   Didn't feel right one night   Checked Apple watch and it said he was in afib    He denies dizziness.  No CP   Says he just "doesn't feel right"  Currently not dizzy  No palpitations   Current Meds  Medication Sig   atorvastatin (LIPITOR) 80 MG tablet Take 1 tablet (80 mg total) by mouth daily.   diltiazem (CARDIZEM CD) 240 MG 24 hr capsule TAKE ONE CAPSULE EACH DAY   ezetimibe (ZETIA) 10 MG tablet TAKE ONE TABLET DAILY IN ADDITION TO ATORVASTATIN   metoprolol succinate (TOPROL-XL) 100 MG 24 hr tablet TAKE ONE TABLET DAILY WITH OR IMMEDIATELY FOLLOWING A MEAL   XARELTO 20 MG TABS tablet TAKE ONE TABLET DAILY WITH BREAKFAST   [DISCONTINUED] furosemide (LASIX) 20 MG tablet TAKE ONE TABLET DAILY   [DISCONTINUED] losartan (COZAAR) 100 MG tablet Take 0.5 tablets (50 mg total) by mouth daily. -- Office visit needed for further refills   [DISCONTINUED] spironolactone (ALDACTONE) 25 MG tablet Take 0.5 tablets (12.5 mg total) by mouth daily. (Patient taking differently: Take 25 mg by mouth daily.)     Allergies:   Patient has no known allergies.   Past Medical History:  Diagnosis Date   CAD (coronary artery disease)    Diverticulosis 12/2012   colonoscopy   Family hx of colon cancer    mother   Habitual alcohol use    History of prostate cancer    Dr.Peterson   History of skin cancer    basal cell, Dr.Turner    Hx of colonic  polyp    Dr Sharlett Iles   Hyperlipidemia    NMR 2011, LDL 103 (1410/120),HDL 73, TG 76. LDL goal =<130   Hypertension    Hypertensive encephalopathy    08/01/2009-08/03/2009    Past Surgical History:  Procedure Laterality Date   CARDIOVERSION N/A 06/29/2017   Procedure: CARDIOVERSION;  Surgeon: Jacolyn Reedy, MD;  Location: [facility] ENDOSCOPY;  Service: Cardiovascular;  Laterality: N/A;   CATARACT EXTRACTION  2010   OD, Dr.Digby   COLONOSCOPY W/ POLYPECTOMY     2000, Neg [id number] & 2014. Dr.Patterson   PROSTATECTOMY  2002   Radiation treatmens 2002-2003   TONSILLECTOMY       Social History:  The patient  reports that he quit smoking about 43 years ago. His smoking use included cigarettes. He has never used smokeless tobacco. He reports current alcohol use of about 10.0 standard drinks of alcohol per week. He reports that he does not use drugs.   Family History:  The patient's family history includes Atrial fibrillation in his father; Colon cancer (age of onset: 79) in his mother; Heart attack (age of onset: 68) in his maternal grandfather; Pancreatic cancer in his maternal grandmother; Prostate cancer (  age of onset: 35) in his brother; Stomach cancer in his paternal grandfather.    ROS:  Please see the history of present illness. All other systems are reviewed and  Negative to the above problem except as noted.    PHYSICAL EXAM: VS:  BP 110/80 (BP Location: Left Arm, Patient Position: Supine, Cuff Size: Normal)   Pulse 80   Wt 225 lb (102.1 kg)   SpO2 96%   BMI 33.23 kg/m   GEN: Obese 81 yo  in no acute distress  HEENT: normal  Neck: no JVD, no carotid bruits Cardiac: RRR; no murmurs or gallops,Triv lower extremity edema  Respiratory:  clear to auscultation bilaterally, no wheezes  GI: soft, nontender, nondistended, + BS  No hepatomegaly  MS: no deformity Moving all extremities   Skin: warm and dry, no rash Neuro:  Strength and sensation are intact Psych: euthymic mood,  full affect   EKG:  EKG shows   Afib   82 bpm   RBBB   Lipid Panel    Component Value Date/Time   CHOL 125 07/17/2019 0839   TRIG 88 07/17/2019 0839   HDL 61 07/17/2019 0839   CHOLHDL 2.0 07/17/2019 0839   CHOLHDL 3 11/10/2016 0759   VLDL 18.4 11/10/2016 0759   LDLCALC 47 07/17/2019 0839   LDLDIRECT 127.3 09/13/2011 1151      Wt Readings from Last 3 Encounters:  05/30/22 225 lb (102.1 kg)  06/15/21 224 lb 6.4 oz (101.8 kg)  06/15/20 231 lb (104.8 kg)      ASSESSMENT AND PLAN:  1  PAF PT is back in afib    Rates OK     Unfortunately he missed a Xarelto dose last week     Concern, with trip  ? Could have misse more  Will recomm setting up for TEE / DCCV      Labs today   2   CAD   patient with coronary calcifications on CT  Pt without angina   Risk factor modify    3   HL last lipid panel on Zetia and Lipitor  Last year was excellent control  WIll get today   4   HTN  BP 105/ On 1/2 losartan; taking other meds   Will stop losartan, stop lasix, stop spironolactone   until cardioversion    5   Obesity   Discussseed diet    Limit sugars   F/U a few wks after cardioverstion to confirm OK Then in 1 year    Current medicines are reviewed at length with the patient today.  The patient does not have concerns regarding medicines.  Signed, Dorris Carnes, MD  05/30/2022 1:12 PM    [facility] [place], [place], [place]  [address] Phone: [phone]; Fax: [phone]

## 2022-05-31 ENCOUNTER — Ambulatory Visit: Payer: Medicare Other | Attending: Cardiovascular Disease

## 2022-05-31 DIAGNOSIS — O10019 Pre-existing essential hypertension complicating pregnancy, unspecified trimester: Secondary | ICD-10-CM

## 2022-05-31 DIAGNOSIS — Z01810 Encounter for preprocedural cardiovascular examination: Secondary | ICD-10-CM

## 2022-05-31 DIAGNOSIS — E782 Mixed hyperlipidemia: Secondary | ICD-10-CM

## 2022-05-31 DIAGNOSIS — I480 Paroxysmal atrial fibrillation: Secondary | ICD-10-CM

## 2022-05-31 NOTE — Addendum Note (Signed)
Addended by: Stephani Police on: 05/31/2022 08:27 AM   Modules accepted: Orders

## 2022-06-01 LAB — APOLIPOPROTEIN B: Apolipoprotein B: 54 mg/dL (ref <90)

## 2022-06-01 LAB — CBC
Hematocrit: 41.2 % (ref 37.5–51.0)
Hemoglobin: 14.3 g/dL (ref 13.0–17.7)
MCH: 32.8 pg (ref 26.6–33.0)
MCHC: 34.7 g/dL (ref 31.5–35.7)
MCV: 95 fL (ref 79–97)
Platelets: 246 10*3/uL (ref 150–450)
RBC: 4.36 x10E6/uL (ref 4.14–5.80)
RDW: 12.1 % (ref 11.6–15.4)
WBC: 5.8 10*3/uL (ref 3.4–10.8)

## 2022-06-01 LAB — NMR, LIPOPROFILE
Cholesterol, Total: 125 mg/dL (ref 100–199)
HDL Particle Number: 36 umol/L (ref >=30.5)
HDL-C: 57 mg/dL (ref >39)
LDL Particle Number: 709 nmol/L (ref <1000)
LDL Size: 20.5 nm — ABNORMAL LOW (ref >20.5)
LDL-C (NIH Calc): 53 mg/dL (ref 0–99)
LP-IR Score: 35 (ref <=45)
Small LDL Particle Number: 376 nmol/L (ref <=527)
Triglycerides: 78 mg/dL (ref 0–149)

## 2022-06-01 LAB — BASIC METABOLIC PANEL
BUN/Creatinine Ratio: 19 (ref 10–24)
BUN: 25 mg/dL (ref 8–27)
CO2: 24 mmol/L (ref 20–29)
Calcium: 9.2 mg/dL (ref 8.6–10.2)
Chloride: 102 mmol/L (ref 96–106)
Creatinine, Ser: 1.31 mg/dL — ABNORMAL HIGH (ref 0.76–1.27)
Glucose: 120 mg/dL — ABNORMAL HIGH (ref 70–99)
Potassium: 4.7 mmol/L (ref 3.5–5.2)
Sodium: 140 mmol/L (ref 134–144)
eGFR: 55 mL/min/{1.73_m2} — ABNORMAL LOW (ref >59)

## 2022-06-01 LAB — TSH: TSH: 2.13 u[IU]/mL (ref 0.450–4.500)

## 2022-06-01 LAB — LIPOPROTEIN A (LPA): Lipoprotein (a): 16.5 nmol/L (ref <75.0)

## 2022-06-02 ENCOUNTER — Ambulatory Visit (HOSPITAL_COMMUNITY)
Admission: RE | Admit: 2022-06-02 | Discharge: 2022-06-02 | Disposition: A | Discharge status: Home / Self Care | Payer: Medicare Other | Attending: Cardiology | Admitting: Cardiology

## 2022-06-02 ENCOUNTER — Encounter (HOSPITAL_COMMUNITY): Payer: Self-pay | Admitting: Cardiology

## 2022-06-02 ENCOUNTER — Other Ambulatory Visit: Payer: Self-pay

## 2022-06-02 ENCOUNTER — Ambulatory Visit (HOSPITAL_BASED_OUTPATIENT_CLINIC_OR_DEPARTMENT_OTHER)
Admission: RE | Admit: 2022-06-02 | Discharge: 2022-06-02 | Disposition: A | Discharge status: Home / Self Care | Payer: Medicare Other | Source: Ambulatory Visit | Attending: Internal Medicine | Admitting: Internal Medicine

## 2022-06-02 ENCOUNTER — Ambulatory Visit (HOSPITAL_BASED_OUTPATIENT_CLINIC_OR_DEPARTMENT_OTHER): Payer: Medicare Other | Admitting: General Practice

## 2022-06-02 ENCOUNTER — Ambulatory Visit (HOSPITAL_COMMUNITY): Payer: Medicare Other | Admitting: General Practice

## 2022-06-02 ENCOUNTER — Encounter (HOSPITAL_COMMUNITY)
Admission: RE | Disposition: A | Discharge status: Home / Self Care | Payer: Self-pay | Source: Home / Self Care | Attending: Cardiology

## 2022-06-02 DIAGNOSIS — I340 Nonrheumatic mitral (valve) insufficiency: Secondary | ICD-10-CM | POA: Diagnosis not present

## 2022-06-02 DIAGNOSIS — I10 Essential (primary) hypertension: Secondary | ICD-10-CM | POA: Insufficient documentation

## 2022-06-02 DIAGNOSIS — I081 Rheumatic disorders of both mitral and tricuspid valves: Secondary | ICD-10-CM | POA: Diagnosis not present

## 2022-06-02 DIAGNOSIS — Z7901 Long term (current) use of anticoagulants: Secondary | ICD-10-CM | POA: Diagnosis not present

## 2022-06-02 DIAGNOSIS — Z87891 Personal history of nicotine dependence: Secondary | ICD-10-CM | POA: Insufficient documentation

## 2022-06-02 DIAGNOSIS — I4819 Other persistent atrial fibrillation: Secondary | ICD-10-CM

## 2022-06-02 DIAGNOSIS — I480 Paroxysmal atrial fibrillation: Secondary | ICD-10-CM | POA: Insufficient documentation

## 2022-06-02 DIAGNOSIS — I080 Rheumatic disorders of both mitral and aortic valves: Secondary | ICD-10-CM | POA: Diagnosis not present

## 2022-06-02 DIAGNOSIS — Z79899 Other long term (current) drug therapy: Secondary | ICD-10-CM | POA: Insufficient documentation

## 2022-06-02 DIAGNOSIS — Z6833 Body mass index (BMI) 33.0-33.9, adult: Secondary | ICD-10-CM | POA: Insufficient documentation

## 2022-06-02 DIAGNOSIS — E669 Obesity, unspecified: Secondary | ICD-10-CM | POA: Insufficient documentation

## 2022-06-02 DIAGNOSIS — I351 Nonrheumatic aortic (valve) insufficiency: Secondary | ICD-10-CM | POA: Diagnosis not present

## 2022-06-02 DIAGNOSIS — E785 Hyperlipidemia, unspecified: Secondary | ICD-10-CM | POA: Diagnosis not present

## 2022-06-02 DIAGNOSIS — I2510 Atherosclerotic heart disease of native coronary artery without angina pectoris: Secondary | ICD-10-CM

## 2022-06-02 DIAGNOSIS — I4891 Unspecified atrial fibrillation: Secondary | ICD-10-CM

## 2022-06-02 DIAGNOSIS — I350 Nonrheumatic aortic (valve) stenosis: Secondary | ICD-10-CM | POA: Diagnosis not present

## 2022-06-02 HISTORY — PX: CARDIOVERSION: SHX1299

## 2022-06-02 HISTORY — PX: TEE WITHOUT CARDIOVERSION: SHX5443

## 2022-06-02 LAB — ECHO TEE
AV Mean grad: 10.7 mmHg
AV Peak grad: 16.5 mmHg
Ao pk vel: 2.03 m/s

## 2022-06-02 SURGERY — ECHOCARDIOGRAM, TRANSESOPHAGEAL
Anesthesia: Monitor Anesthesia Care

## 2022-06-02 MED ORDER — PHENYLEPHRINE 80 MCG/ML (10ML) SYRINGE FOR IV PUSH (FOR BLOOD PRESSURE SUPPORT)
PREFILLED_SYRINGE | INTRAVENOUS | Status: DC | PRN
  Administered 2022-06-02: 160 ug via INTRAVENOUS
  Administered 2022-06-02: 80 ug via INTRAVENOUS
  Administered 2022-06-02 (×4): 160 ug via INTRAVENOUS

## 2022-06-02 MED ORDER — LIDOCAINE 2% (20 MG/ML) 5 ML SYRINGE
INTRAMUSCULAR | Status: DC | PRN
  Administered 2022-06-02: 100 mg via INTRAVENOUS

## 2022-06-02 MED ORDER — PROPOFOL 10 MG/ML IV BOLUS
INTRAVENOUS | Status: DC | PRN
  Administered 2022-06-02: 20 mg via INTRAVENOUS
  Administered 2022-06-02: 30 mg via INTRAVENOUS

## 2022-06-02 MED ORDER — EPHEDRINE SULFATE-NACL 50-0.9 MG/10ML-% IV SOSY
PREFILLED_SYRINGE | INTRAVENOUS | Status: DC | PRN
  Administered 2022-06-02 (×2): 5 mg via INTRAVENOUS

## 2022-06-02 MED ORDER — PROPOFOL 500 MG/50ML IV EMUL
INTRAVENOUS | Status: DC | PRN
  Administered 2022-06-02: 150 ug/kg/min via INTRAVENOUS

## 2022-06-02 MED ORDER — SODIUM CHLORIDE 0.9 % IV SOLN: INTRAVENOUS | Status: DC | PRN

## 2022-06-02 NOTE — Progress Notes (Deleted)
  Echocardiogram 2D Echocardiogram has been performed.  Eartha Inch 06/02/2022, 10:27 AM

## 2022-06-02 NOTE — Transfer of Care (Signed)
Immediate Anesthesia Transfer of Care Note  Patient: Russell [name]  Procedure(s) Performed: TRANSESOPHAGEAL ECHOCARDIOGRAM (TEE) CARDIOVERSION  Patient Location: PACU  Anesthesia Type:MAC  Level of Consciousness: drowsy  Airway & Oxygen Therapy: Patient Spontanous Breathing and Patient connected to nasal cannula oxygen  Post-op Assessment: Report given to RN and Post -op Vital signs reviewed and stable  Post vital signs: Reviewed and stable  Last Vitals:  Vitals Value Taken Time  BP    Temp    Pulse    Resp    SpO2      Last Pain:  Vitals:   06/02/22 0919  TempSrc: Temporal  PainSc: 0-No pain         Complications: No notable events documented.

## 2022-06-02 NOTE — Anesthesia Procedure Notes (Signed)
Procedure Name: MAC Date/Time: 06/02/2022 9:46 AM  Performed by: Dorann Lodge, CRNAPre-anesthesia Checklist: Patient identified, Emergency Drugs available, Suction available and Patient being monitored Patient Re-evaluated:Patient Re-evaluated prior to induction Oxygen Delivery Method: Nasal cannula Airway Equipment and Method: Bite block Dental Injury: Teeth and Oropharynx as per pre-operative assessment

## 2022-06-02 NOTE — Anesthesia Postprocedure Evaluation (Signed)
Anesthesia Post Note  Patient: Russell [name]  Procedure(s) Performed: TRANSESOPHAGEAL ECHOCARDIOGRAM (TEE) CARDIOVERSION     Patient location during evaluation: PACU Anesthesia Type: MAC Level of consciousness: awake and sedated Pain management: pain level controlled Vital Signs Assessment: post-procedure vital signs reviewed and stable Respiratory status: spontaneous breathing Cardiovascular status: stable Postop Assessment: no apparent nausea or vomiting Anesthetic complications: no   No notable events documented.  Last Vitals:  Vitals:   06/02/22 1018 06/02/22 1030  BP: 96/69 93/64  Pulse: (!) 52 (!) 57  Resp: 16 16  Temp: (!) 36.1 C   SpO2: 95% 95%    Last Pain:  Vitals:   06/02/22 1018  TempSrc:   PainSc: 0-No pain                 Huston Foley

## 2022-06-02 NOTE — Interval H&P Note (Signed)
History and Physical Interval Note:  06/02/2022 9:28 AM  Russell [name]  has presented today for surgery, with the diagnosis of afib.  The various methods of treatment have been discussed with the patient and family. After consideration of risks, benefits and other options for treatment, the patient has consented to  Procedure(s): TRANSESOPHAGEAL ECHOCARDIOGRAM (TEE) (N/A) CARDIOVERSION (N/A) as a surgical intervention.  The patient's history has been reviewed, patient examined, no change in status, stable for surgery.  I have reviewed the patient's chart and labs.  Questions were answered to the patient's satisfaction.     [employer]

## 2022-06-02 NOTE — Anesthesia Preprocedure Evaluation (Addendum)
Anesthesia Evaluation  Patient identified by MRN, date of birth, ID band Patient awake    Reviewed: Allergy & Precautions, NPO status , Patient's Chart, lab work & pertinent test results, reviewed documented beta blocker date and time   Airway Mallampati: I       Dental no notable dental hx.    Pulmonary former smoker,    Pulmonary exam normal        Cardiovascular hypertension, Pt. on medications and Pt. on home beta blockers + CAD  + dysrhythmias Atrial Fibrillation  Rhythm:Irregular Rate:Normal     Neuro/Psych negative psych ROS   GI/Hepatic negative GI ROS, Neg liver ROS,   Endo/Other  negative endocrine ROS  Renal/GU negative Renal ROS  negative genitourinary   Musculoskeletal negative musculoskeletal ROS (+)   Abdominal (+) + obese,   Peds  Hematology negative hematology ROS (+)   Anesthesia Other Findings   Reproductive/Obstetrics                             Anesthesia Physical Anesthesia Plan  ASA: 3  Anesthesia Plan: General   Post-op Pain Management:    Induction:   PONV Risk Score and Plan: Propofol infusion and TIVA  Airway Management Planned: Natural Airway and Mask  Additional Equipment: TEE  Intra-op Plan:   Post-operative Plan:   Informed Consent: I have reviewed the patients History and Physical, chart, labs and discussed the procedure including the risks, benefits and alternatives for the proposed anesthesia with the patient or authorized representative who has indicated his/her understanding and acceptance.       Plan Discussed with: CRNA  Anesthesia Plan Comments:        Anesthesia Quick Evaluation

## 2022-06-02 NOTE — CV Procedure (Signed)
   Transesophageal Echocardiogram  Indications: Atrial fibrillation  Time out performed  During this procedure the patient was administered propofol under anesthesiology supervision to achieve and maintain moderate sedation.  The patient's heart rate, blood pressure, and oxygen saturation are monitored continuously during the procedure.   Findings:  Left Ventricle: Ejection fraction 55% normal  Mitral Valve: Mild mitral valve regurgitation with moderate mitral annular calcification  Aortic Valve: Moderate aortic valve sclerosis, very mild stenosis V-max 2.15 m/s  Tricuspid Valve: Mild TR  Left Atrium: Normal, no left atrial appendage thrombus  Right Atrium: Normal  Intraatrial septum: Normal  Bubble Contrast Study: Not performed  Candee Furbish, MD     Electrical Cardioversion Procedure Note Russell [name] [id number] 1940/11/09  Procedure: Electrical Cardioversion Indications:  Atrial Fibrillation  Time Out: Verified patient identification, verified procedure,medications/allergies/relevent history reviewed, required imaging and test results available.  Performed  Procedure Details  The patient was NPO after midnight. Anesthesia was administered at the beside with anesthesia propofol.  Cardioversion was performed with synchronized biphasic defibrillation via AP pads with 200 joules.  1 attempt(s) were performed.  The patient converted to normal sinus rhythm. The patient tolerated the procedure well   IMPRESSION:  Successful cardioversion of atrial fibrillation    Candee Furbish 06/02/2022, 10:12 AM

## 2022-06-02 NOTE — Progress Notes (Signed)
  Echocardiogram Echocardiogram Transesophageal has been performed.  Russell [name] 06/02/2022, 10:28 AM

## 2022-06-06 ENCOUNTER — Encounter (HOSPITAL_COMMUNITY): Payer: Self-pay | Admitting: Cardiology

## 2022-06-08 ENCOUNTER — Telehealth: Payer: Self-pay

## 2022-06-08 NOTE — Telephone Encounter (Signed)
Patient has appointment with Dr. Havery Moros tomorrow but it appears he is scheduled for TEE tomorrow morning. Called patient and asked him to call us back to confirm if he is scheduled for TEE or is he planning on coming for his appointment tomorrow afternoon.

## 2022-06-09 ENCOUNTER — Encounter (HOSPITAL_COMMUNITY): Admission: RE | Payer: Self-pay | Source: Home / Self Care

## 2022-06-09 ENCOUNTER — Encounter: Payer: Self-pay | Admitting: Gastroenterology

## 2022-06-09 ENCOUNTER — Ambulatory Visit: Payer: Medicare Other | Admitting: Gastroenterology

## 2022-06-09 ENCOUNTER — Ambulatory Visit (HOSPITAL_COMMUNITY): Admission: RE | Admit: 2022-06-09 | Payer: Medicare Other | Source: Home / Self Care | Admitting: Cardiovascular Disease

## 2022-06-09 VITALS — BP 106/78 | HR 62 | Ht 69.0 in | Wt 228.0 lb

## 2022-06-09 DIAGNOSIS — Z7901 Long term (current) use of anticoagulants: Secondary | ICD-10-CM

## 2022-06-09 DIAGNOSIS — Z800 Family history of malignant neoplasm of digestive organs: Secondary | ICD-10-CM | POA: Diagnosis not present

## 2022-06-09 DIAGNOSIS — I4891 Unspecified atrial fibrillation: Secondary | ICD-10-CM | POA: Diagnosis not present

## 2022-06-09 DIAGNOSIS — Z86010 Personal history of colonic polyps: Secondary | ICD-10-CM

## 2022-06-09 SURGERY — ECHOCARDIOGRAM, TRANSESOPHAGEAL
Anesthesia: Monitor Anesthesia Care

## 2022-06-09 NOTE — Patient Instructions (Signed)
If you are age 80 or older, your body mass index should be between 23-30. Your Body mass index is 33.67 kg/m. If this is out of the aforementioned range listed, please consider follow up with your Primary Care Provider.  If you are age 24 or younger, your body mass index should be between 19-25. Your Body mass index is 33.67 kg/m. If this is out of the aformentioned range listed, please consider follow up with your Primary Care Provider.   ________________________________________________________   Please follow up in 2 years to discuss colonoscopy.  Thank you for entrusting me with your care and for choosing [facility], Dr. [place] Cellar

## 2022-06-09 NOTE — Progress Notes (Signed)
HPI :   81 year old male with a history of CAD, atrial fibrillation on Xarelto, history of colon polyps, family history of colon cancer, here to discuss timing of potential surveillance colonoscopy.  I have not seen him since September 2020.  Recall his mother had colon cancer who passed from it at age 14.  He reports having colonoscopies every 5 years or so historically.  His last colonoscopy was performed in May 2014, good prep reported, no polyps.  His last exam with me was in September 2020.  He had 4 diminutive polyps removed, 3 of them were adenomas.  At the time I recommended 3-year follow-up based on national guidelines for surveillance of colon polyps.  Since that time the guidelines have been updated and he would not warrant follow-up for 3 years or so given his last result.  He generally denies any problems with his bowels.  No blood in stools.  No abdominal pains.  He generally has felt well however was in the hospital 2 weeks ago with recurrent A-fib.  He had a TEE followed by cardioversion to get him out of A-fib.  He typically feels symptomatic when he has it.  He has been feeling well since that time without any obvious recurrence.  He remains on Xarelto.  We discussed at his age and comorbidities if he wanted to have any further colonoscopy exams or not.  He otherwise denies any major changes in his health otherwise since I have last seen him.   Colonoscopy 12/2012 - good prep, no polyps., diverticulosis.   Echo 03/2017 - EF 50-55%  Colonoscopy 05/08/19: The perianal and digital rectal examinations were normal other than skin tags. - A diminutive polyp was found in the cecum. The polyp was sessile. The polyp was removed with a cold snare. Resection and retrieval were complete. - A 3 mm polyp was found in the ascending colon. The polyp was flat. The polyp was removed with a cold snare. Resection and retrieval were complete. - A 4 mm polyp was found in the transverse colon. The  polyp was sessile. The polyp was removed with a cold snare. Resection and retrieval were complete. - A 3 mm polyp was found in the sigmoid colon. The polyp was sessile. The polyp was removed with a cold snare. Resection and retrieval were complete. - Multiple small-mouthed diverticula were found in the sigmoid colon. - Internal hemorrhoids were found during retroflexion. The hemorrhoids were moderate. - Anal papilla(e) were hypertrophied. - The exam was otherwise without abnormality.   Surgical [P], colon, transverse, ascending, sigmoid and cecum, polyp (4) - TUBULAR ADENOMA (4 OF 6 FRAGMENTS) - BENIGN COLONIC MUCOSA (2 OF 6 FRAGMENTS) - NO HIGH GRADE DYSPLASIA OR MALIGNANCY IDENTIFIED  TEE 06/02/22: EF 55-60%   Past Medical History:  Diagnosis Date   CAD (coronary artery disease)    Diverticulosis 12/2012   colonoscopy   Family hx of colon cancer    mother   Habitual alcohol use    History of prostate cancer    Dr.Peterson   History of skin cancer    basal cell, Dr.Turner    Hx of colonic polyp    Dr Sharlett Iles   Hyperlipidemia    NMR 2011, LDL 103 (1410/120),HDL 73, TG 76. LDL goal =<130   Hypertension    Hypertensive encephalopathy    08/01/2009-08/03/2009     Past Surgical History:  Procedure Laterality Date   CARDIOVERSION N/A 06/29/2017   Procedure: CARDIOVERSION;  Surgeon: Jacolyn Reedy, MD;  Location: [facility];  Service: Cardiovascular;  Laterality: N/A;   CARDIOVERSION N/A 06/02/2022   Procedure: CARDIOVERSION;  Surgeon: Jerline Pain, MD;  Location: [facility];  Service: Cardiovascular;  Laterality: N/A;   CATARACT EXTRACTION  2010   OD, Dr.Digby   COLONOSCOPY W/ POLYPECTOMY     2000, Neg [id number] & 2014. Dr.Patterson   PROSTATECTOMY  2002   Radiation treatmens 2002-2003   TEE WITHOUT CARDIOVERSION N/A 06/02/2022   Procedure: TRANSESOPHAGEAL ECHOCARDIOGRAM (TEE);  Surgeon: Jerline Pain, MD;  Location: [facility] ENDOSCOPY;  Service: Cardiovascular;   Laterality: N/A;   TONSILLECTOMY     Family History  Problem Relation Age of Onset   Atrial fibrillation Father    Colon cancer Mother 69   Prostate cancer Brother 22   Heart attack Maternal Grandfather 68   Pancreatic cancer Maternal Grandmother    Stomach cancer Paternal Grandfather    Diabetes Neg Hx    Stroke Neg Hx    Esophageal cancer Neg Hx    Rectal cancer Neg Hx    Social History   Tobacco Use   Smoking status: Former    Types: Cigarettes    Quit date: 08/15/1978    Years since quitting: 43.8   Smokeless tobacco: Never   Tobacco comments:    smoked age [age], up to 1ppd  Vaping Use   Vaping Use: Never used  Substance Use Topics   Alcohol use: Yes    Alcohol/week: 10.0 standard drinks of alcohol    Types: 10 Glasses of wine per week    Comment: Drinks 2-3 drinks per day   Drug use: No   Current Outpatient Medications  Medication Sig Dispense Refill   atorvastatin (LIPITOR) 80 MG tablet Take 1 tablet (80 mg total) by mouth daily. 90 tablet 3   diltiazem (CARDIZEM CD) 240 MG 24 hr capsule TAKE ONE CAPSULE EACH DAY 90 capsule 3   ezetimibe (ZETIA) 10 MG tablet TAKE ONE TABLET DAILY IN ADDITION TO ATORVASTATIN 90 tablet 2   furosemide (LASIX) 20 MG tablet Take 20 mg by mouth.     losartan (COZAAR) 100 MG tablet Take 100 mg by mouth daily.     metoprolol succinate (TOPROL-XL) 100 MG 24 hr tablet TAKE ONE TABLET DAILY WITH OR IMMEDIATELY FOLLOWING A MEAL 90 tablet 3   spironolactone (ALDACTONE) 25 MG tablet Take 25 mg by mouth daily.     XARELTO 20 MG TABS tablet TAKE ONE TABLET DAILY WITH BREAKFAST 90 tablet 1   No current facility-administered medications for this visit.   No Known Allergies   Review of Systems: All systems reviewed and negative except where noted in HPI.    ECHO TEE  Result Date: 06/02/2022    TRANSESOPHOGEAL ECHO REPORT   Patient Name:   Russell [name] Date of Exam: 06/02/2022 Medical Rec #:  [id number]         Height:       69.0 in  Accession #:    [id number]        Weight:       225.0 lb Date of Birth:  [birth year]         BSA:          2.172 m Patient Age:    37 years          BP:           96/69 mmHg Patient Gender: M  HR:           105 bpm. Exam Location:  Inpatient Procedure: 3D Echo, Cardiac Doppler, Color Doppler and Transesophageal Echo Indications:    Atrial fibrillation  History:        Patient has prior history of Echocardiogram examinations, most                 recent 04/04/2017. CAD, Aortic Valve Disease, Arrythmias:Atrial                 Fibrillation, Signs/Symptoms:Shortness of Breath; Risk                 Factors:Hypertension and Dyslipidemia.  Sonographer:    Eartha Inch Referring Phys: Carmin Muskrat ROSS PROCEDURE: After discussion of the risks and benefits of a TEE, an informed consent was obtained from the patient. TEE procedure time was 15 minutes. The transesophogeal probe was passed without difficulty through the esophogus of the patient. Imaged were obtained with the patient in a left lateral decubitus position. Sedation performed by different physician. The patient was monitored while under deep sedation. Anesthestetic sedation was provided intravenously by Anesthesiology: 369.'06mg'$  of Propofol, '100mg'$  of Lidocaine. Image quality was good. The patient's vital signs; including heart rate, blood pressure, and oxygen saturation; remained stable throughout the procedure. The patient developed no complications during the procedure. A successful direct current cardioversion was performed at 200 joules with 1 attempt. IMPRESSIONS  1. Left ventricular ejection fraction, by estimation, is 55 to 60%. The left ventricle has normal function.  2. Right ventricular systolic function is normal. The right ventricular size is normal.  3. Left atrial size was moderately dilated. No left atrial/left atrial appendage thrombus was detected.  4. Right atrial size was moderately dilated.  5. The mitral valve is normal in structure.  Mild mitral valve regurgitation. Moderate mitral annular calcification.  6. The aortic valve is tricuspid. There is moderate calcification of the aortic valve. There is moderate thickening of the aortic valve. Aortic valve regurgitation is trivial. Mild aortic valve stenosis. Aortic valve mean gradient measures 10.7 mmHg. Aortic valve Vmax measures 2.03 m/s. Conclusion(s)/Recommendation(s): No LA/LAA thrombus identified. Successful cardioversion performed with restoration of normal sinus rhythm. FINDINGS  Left Ventricle: Left ventricular ejection fraction, by estimation, is 55 to 60%. The left ventricle has normal function. The left ventricular internal cavity size was normal in size. Right Ventricle: The right ventricular size is normal. No increase in right ventricular wall thickness. Right ventricular systolic function is normal. Left Atrium: Left atrial size was moderately dilated. No left atrial/left atrial appendage thrombus was detected. Right Atrium: Right atrial size was moderately dilated. Pericardium: There is no evidence of pericardial effusion. Mitral Valve: The mitral valve is normal in structure. Moderate mitral annular calcification. Mild mitral valve regurgitation. Tricuspid Valve: The tricuspid valve is normal in structure. Tricuspid valve regurgitation is mild. Aortic Valve: The aortic valve is tricuspid. There is moderate calcification of the aortic valve. There is moderate thickening of the aortic valve. Aortic valve regurgitation is trivial. Mild aortic stenosis is present. Aortic valve mean gradient measures 10.7 mmHg. Aortic valve peak gradient measures 16.5 mmHg. Pulmonic Valve: The pulmonic valve was normal in structure. Pulmonic valve regurgitation is not visualized. Aorta: The aortic root is normal in size and structure. IAS/Shunts: No atrial level shunt detected by color flow Doppler.  AORTIC VALVE AV Vmax:      203.00 cm/s AV Vmean:     154.000 cm/s AV VTI:  0.562 m AV Peak Grad:  16.5 mmHg AV Mean Grad: 10.7 mmHg  AORTA Ao Asc diam: 3.60 cm TRICUSPID VALVE TR Peak grad:   18.8 mmHg TR Vmax:        217.00 cm/s Candee Furbish MD Electronically signed by Candee Furbish MD Signature Date/Time: 06/02/2022/10:55:36 AM    Final      Lab Results  Component Value Date   WBC 5.8 05/31/2022   HGB 14.3 05/31/2022   HCT 41.2 05/31/2022   MCV 95 05/31/2022   PLT 246 05/31/2022    Lab Results  Component Value Date   CREATININE 1.31 (H) 05/31/2022   BUN 25 05/31/2022   NA 140 05/31/2022   K 4.7 05/31/2022   CL 102 05/31/2022   CO2 24 05/31/2022    Lab Results  Component Value Date   ALT 22 11/18/2019   AST 26 11/18/2019   ALKPHOS 94 11/18/2019   BILITOT 0.4 11/18/2019    Physical Exam: BP 106/78   Pulse 62   Ht '5\' 9"'$  (1.753 m)   Wt 228 lb (103.4 kg)   BMI 33.67 kg/m  Constitutional: Pleasant,well-developed, male in no acute distress. HEENT: Normocephalic and atraumatic. Conjunctivae are normal. No scleral icterus. Neck supple.  Cardiovascular: Normal rate, regular rhythm.  Pulmonary/chest: Effort normal and breath sounds normal.  Abdominal: Soft, nondistended, nontender. There are no masses palpable.  Extremities: no edema Neurological: Alert and oriented to person place and time. Skin: Skin is warm and dry. No rashes noted. Psychiatric: Normal mood and affect. Behavior is normal.   ASSESSMENT: 81 y.o. male here for assessment of the following  1. History of colon polyps   2. Family history of colon cancer   3. Anticoagulated   4. Atrial fibrillation, unspecified type [facility])    Reviewed the patient's family history of colon cancer with him and his prior colonoscopy reports.  At the time of his last exam I had recommended a colonoscopy 3 years later based on national surveillance guidelines at that time for 3 small adenomas.  Since that time the guidelines have been updated, now okay to wait 5 years from his last exam to consider surveillance in the absence  of any new symptoms, thus would not be due till September 2025.  At that time he will be 81 years old.  We discussed at that age and his comorbidities, he is higher than average risk for procedures.  Overall given he has not had any high risk polyps in his colon, I think he is unlikely to get colon cancer in his lifetime and at 81 years old I think risks of future colonoscopy may outweigh the benefits, and reasonable to forgo future colonoscopy exams if he is comfortable with that.  We discussed this for a bit.  He does not want to make any decisions now, he would like to see me in 2 years for reassessment to see how he is feeling and he can decide how he wants to proceed at that time.  Certainly if he has any bowel symptoms bother him in the interim he will contact me.  He agrees.   PLAN: - holding off on procedures at this time. Per guidelines would not be due for another colonoscopy exam until 9.2025, at which time he will be 81 years old. I don't think he needs another exam, he wishes to see me in Sept 2025 to discuss it again - follow up PRN in the interim  Jolly Mango, MD [facility]  CC: Tisovec, Fransico Him, MD

## 2022-06-19 ENCOUNTER — Other Ambulatory Visit: Payer: Self-pay | Admitting: Internal Medicine

## 2022-06-29 NOTE — Progress Notes (Unsigned)
Cardiology Office Note   Date:  06/30/2022   ID:  Russell [name], DOB [birth year], MRN [id number]  PCP:  Haywood Pao, MD  Cardiologist:   Dorris Carnes, MD   Pt presents for f/u of atrial fibrillation   He is s/p cardioversion   History of Present Illness: Russell [name] is a 81 y.o. male with a history of CAD (by CT), HTN, HL and  paroxysmal atrial fibrillation  (2018  Anticoagulatied, s/p cardioversion)  Echo showed LVEF 55%  I saw the pt earlier this fall   [place] beach [place]  Didn't feel right   Russell [name] said he was in afib      Not dizzy When I saw him I backed down on his BP meds as his BP was a little low  He had missed a couple doses of Xarelto he underwent TEE and then underwent DCCV   Since then he has resumed his BP meds   Since cardioversion in October he says he is feeling better   Back to usual activities    Says he only drinks 1 EtOH per day   Current Meds  Medication Sig   atorvastatin (LIPITOR) 80 MG tablet Take 1 tablet (80 mg total) by mouth daily.   diltiazem (CARDIZEM CD) 240 MG 24 hr capsule TAKE ONE CAPSULE EACH DAY   ezetimibe (ZETIA) 10 MG tablet TAKE ONE TABLET DAILY IN ADDITION TO ATORVASTATIN   furosemide (LASIX) 20 MG tablet Take 20 mg by mouth.   losartan (COZAAR) 100 MG tablet Take 100 mg by mouth daily.   metoprolol succinate (TOPROL-XL) 100 MG 24 hr tablet TAKE ONE TABLET DAILY WITH OR IMMEDIATELY FOLLOWING A MEAL   spironolactone (ALDACTONE) 25 MG tablet Take 25 mg by mouth daily.   XARELTO 20 MG TABS tablet TAKE ONE TABLET DAILY WITH BREAKFAST     Allergies:   Patient has no known allergies.   Past Medical History:  Diagnosis Date   CAD (coronary artery disease)    Diverticulosis 12/2012   colonoscopy   Family hx of colon cancer    mother   Habitual alcohol use    History of prostate cancer    Dr.Peterson   History of skin cancer    basal cell, Dr.Turner    Hx of colonic polyp    Dr Sharlett Iles   Hyperlipidemia     NMR 2011, LDL 103 (1410/120),HDL 73, TG 76. LDL goal =<130   Hypertension    Hypertensive encephalopathy    08/01/2009-08/03/2009    Past Surgical History:  Procedure Laterality Date   CARDIOVERSION N/A 06/29/2017   Procedure: CARDIOVERSION;  Surgeon: Jacolyn Reedy, MD;  Location: [facility] ENDOSCOPY;  Service: Cardiovascular;  Laterality: N/A;   CARDIOVERSION N/A 06/02/2022   Procedure: CARDIOVERSION;  Surgeon: Jerline Pain, MD;  Location: [facility] ENDOSCOPY;  Service: Cardiovascular;  Laterality: N/A;   CATARACT EXTRACTION  2010   OD, Dr.Digby   COLONOSCOPY W/ POLYPECTOMY     2000, Neg [id number] & 2014. Dr.Patterson   PROSTATECTOMY  2002   Radiation treatmens 2002-2003   TEE WITHOUT CARDIOVERSION N/A 06/02/2022   Procedure: TRANSESOPHAGEAL ECHOCARDIOGRAM (TEE);  Surgeon: Jerline Pain, MD;  Location: [facility] ENDOSCOPY;  Service: Cardiovascular;  Laterality: N/A;   TONSILLECTOMY       Social History:  The patient  reports that he quit smoking about 43 years ago. His smoking use included cigarettes. He has never used smokeless tobacco. He reports current alcohol use of  about 10.0 standard drinks of alcohol per week. He reports that he does not use drugs.   Family History:  The patient's family history includes Atrial fibrillation in his father; Colon cancer (age of onset: 59) in his mother; Heart attack (age of onset: 21) in his maternal grandfather; Pancreatic cancer in his maternal grandmother; Prostate cancer (age of onset: 104) in his brother; Stomach cancer in his paternal grandfather.    ROS:  Please see the history of present illness. All other systems are reviewed and  Negative to the above problem except as noted.    PHYSICAL EXAM: VS:  BP 128/70   Pulse (!) 59   Ht '5\' 9"'$  (1.753 m)   Wt 225 lb 3.2 oz (102.2 kg)   SpO2 96%   BMI 33.26 kg/m   GEN: Obese 81 yo  in no acute distress  HEENT: normal  Neck: no JVD, no carotid bruit Cardiac: RRR; no murmurs or gallops,Triv lower  extremity edema  Respiratory:  clear to auscultation bilaterally,  GI: soft, nontender, nondistended, + BS  No hepatomegaly  MS: no deformity Moving all extremities   Skin: warm and dry, no rash Neuro:  Strength and sensation are intact Psych: euthymic mood, full affect   EKG:  EKG shows   Sinus bradycardai   59 bpm   RBBB.   LVH     Lipid Panel    Component Value Date/Time   CHOL 125 07/17/2019 0839   TRIG 88 07/17/2019 0839   HDL 61 07/17/2019 0839   CHOLHDL 2.0 07/17/2019 0839   CHOLHDL 3 11/10/2016 0759   VLDL 18.4 11/10/2016 0759   LDLCALC 47 07/17/2019 0839   LDLDIRECT 127.3 09/13/2011 1151      Wt Readings from Last 3 Encounters:  06/30/22 225 lb 3.2 oz (102.2 kg)  06/09/22 228 lb (103.4 kg)  06/02/22 225 lb 1.4 oz (102.1 kg)      ASSESSMENT AND PLAN:  1  PAF s/p TEE/DCCV   Remains in SR  Back to baseline    Keep on same meds   2   CAD   patient with coronary calcifications on CT  Pt without angina   Risk factor modify     3   HL last lipid panel on Zetia and Lipitor  LDL was excellent at 53    4   HTN BP is controlled back on his meds   5   Obesity   Discussseed diet    Limit sugars   F/U next summer   Current medicines are reviewed at length with the patient today.  The patient does not have concerns regarding medicines.  Signed, Dorris Carnes, MD  06/30/2022 9:49 PM    [address], [place], [place]  [address] Phone: [phone]; Fax: [phone]

## 2022-06-30 ENCOUNTER — Encounter: Payer: Self-pay | Admitting: Internal Medicine

## 2022-06-30 ENCOUNTER — Ambulatory Visit: Payer: Medicare Other | Attending: Internal Medicine | Admitting: Internal Medicine

## 2022-06-30 VITALS — BP 128/70 | HR 59 | Ht 69.0 in | Wt 225.2 lb

## 2022-06-30 DIAGNOSIS — I480 Paroxysmal atrial fibrillation: Secondary | ICD-10-CM | POA: Diagnosis not present

## 2022-06-30 DIAGNOSIS — E782 Mixed hyperlipidemia: Secondary | ICD-10-CM | POA: Diagnosis not present

## 2022-06-30 DIAGNOSIS — O10019 Pre-existing essential hypertension complicating pregnancy, unspecified trimester: Secondary | ICD-10-CM

## 2022-06-30 DIAGNOSIS — I10 Essential (primary) hypertension: Secondary | ICD-10-CM

## 2022-06-30 LAB — BASIC METABOLIC PANEL
BUN/Creatinine Ratio: 23 (ref 10–24)
BUN: 27 mg/dL (ref 8–27)
CO2: 23 mmol/L (ref 20–29)
Calcium: 9.1 mg/dL (ref 8.6–10.2)
Chloride: 102 mmol/L (ref 96–106)
Creatinine, Ser: 1.17 mg/dL (ref 0.76–1.27)
Glucose: 89 mg/dL (ref 70–99)
Potassium: 4.4 mmol/L (ref 3.5–5.2)
Sodium: 137 mmol/L (ref 134–144)
eGFR: 63 mL/min/{1.73_m2} (ref >59)

## 2022-06-30 NOTE — Patient Instructions (Signed)
Medication Instructions:   *If you need a refill on your cardiac medications before your next appointment, please call your pharmacy*   Lab Work: bmet  If you have labs (blood work) drawn today and your tests are completely normal, you will receive your results only by: Maries (if you have MyChart) OR A paper copy in the mail If you have any lab test that is abnormal or we need to change your treatment, we will call you to review the results.   Testing/Procedures:    Follow-Up: At [facility], you and your health needs are our priority.  As part of our continuing mission to provide you with exceptional heart care, we have created designated Provider Care Teams.  These Care Teams include your primary Cardiologist (physician) and Advanced Practice Providers (APPs -  Physician Assistants and Nurse Practitioners) who all work together to provide you with the care you need, when you need it.  We recommend signing up for the patient portal called "MyChart".  Sign up information is provided on this After Visit Summary.  MyChart is used to connect with patients for Virtual Visits (Telemedicine).  Patients are able to view lab/test results, encounter notes, upcoming appointments, etc.  Non-urgent messages can be sent to your provider as well.   To learn more about what you can do with MyChart, go to [email or web address].    Your next appointment:   8 month(s)  The format for your next appointment:   In Person  Provider:   Dorris Carnes, MD     Other Instructions   Important Information About Sugar

## 2022-09-07 ENCOUNTER — Other Ambulatory Visit: Payer: Self-pay | Admitting: Internal Medicine

## 2022-09-07 DIAGNOSIS — I480 Paroxysmal atrial fibrillation: Secondary | ICD-10-CM

## 2022-09-07 NOTE — Telephone Encounter (Signed)
Prescription refill request for Xarelto received.  Indication: Afib  Last office visit: 06/30/22 Harrington Challenger)  Weight: 102.2kg Age: 82 Scr: 1.17 (06/30/22)  CrCl: 72.19m/min  Appropriate dose and refill sent to requested pharmacy.

## 2022-10-05 ENCOUNTER — Other Ambulatory Visit: Payer: Self-pay | Admitting: Internal Medicine

## 2022-12-16 ENCOUNTER — Other Ambulatory Visit: Payer: Self-pay | Admitting: Internal Medicine

## 2023-03-21 ENCOUNTER — Other Ambulatory Visit: Payer: Self-pay | Admitting: Internal Medicine

## 2023-03-21 DIAGNOSIS — I480 Paroxysmal atrial fibrillation: Secondary | ICD-10-CM

## 2023-03-21 NOTE — Telephone Encounter (Signed)
Xarelto 20mg  refill request received. Pt is 82 years old, weight-102.2kg, Crea-1.17 on 06/30/22, last seen by Dr. Tenny Craw on 06/30/22, Diagnosis-Afib, CrCl- 71.58 mL/min; Dose is appropriate based on dosing criteria. Will send in refill to requested pharmacy.

## 2023-04-04 ENCOUNTER — Other Ambulatory Visit: Payer: Self-pay | Admitting: Internal Medicine

## 2023-04-24 ENCOUNTER — Telehealth: Payer: Self-pay

## 2023-04-24 NOTE — Telephone Encounter (Signed)
Pt cannot come in sooner due to trip to [place].

## 2023-04-24 NOTE — Telephone Encounter (Signed)
LM for the pt to move up his 05/25/23 OV to this Friday morning with Dr Tenny Craw.

## 2023-05-24 NOTE — Progress Notes (Unsigned)
Cardiology Office Note   Date:  05/24/2023   ID:  Russell [name], DOB 1940/09/11, MRN [id number]  PCP:  Gaspar Garbe, MD  Cardiologist:   Dietrich Pates, MD   Pt presents for f/u of atrial fibrillation   He is s/p cardioversion   History of Present Illness: Russell [name] is a 82 y.o. male with a history of CAD (by CT), HTN, HL and  paroxysmal atrial fibrillation  (2018  Anticoagulatied, s/p cardioversion)  Echo showed LVEF 55%  I saw the pt earlier this fall   He was at beach prior  Didn't feel right   Apple watch said he was in afib      Not dizzy When I saw him I backed down on his BP meds as his BP was a little low  He had missed a couple doses of Xarelto he underwent TEE and then underwent DCCV   Since then he has resumed his BP meds   Since cardioversion in October he says he is feeling better   Back to usual activities    Says he only drinks 1 EtOH per day   I saw the pt in Fall 2023   No outpatient medications have been marked as taking for the 05/25/23 encounter (Appointment) with Pricilla Riffle, MD.     Allergies:   Patient has no known allergies.   Past Medical History:  Diagnosis Date   CAD (coronary artery disease)    Diverticulosis 12/2012   colonoscopy   Family hx of colon cancer    mother   Habitual alcohol use    History of prostate cancer    Dr.Peterson   History of skin cancer    basal cell, Dr.Turner    Hx of colonic polyp    Dr Jarold Motto   Hyperlipidemia    NMR 2011, LDL 103 (1410/120),HDL 73, TG 76. LDL goal =<130   Hypertension    Hypertensive encephalopathy    08/01/2009-08/03/2009    Past Surgical History:  Procedure Laterality Date   CARDIOVERSION N/A 06/29/2017   Procedure: CARDIOVERSION;  Surgeon: Othella Boyer, MD;  Location: [facility] ENDOSCOPY;  Service: Cardiovascular;  Laterality: N/A;   CARDIOVERSION N/A 06/02/2022   Procedure: CARDIOVERSION;  Surgeon: Jake Bathe, MD;  Location: [facility] ENDOSCOPY;  Service: Cardiovascular;   Laterality: N/A;   CATARACT EXTRACTION  2010   OD, Dr.Digby   COLONOSCOPY W/ POLYPECTOMY     2000, Neg [id number] & 2014. Dr.Patterson   PROSTATECTOMY  2002   Radiation treatmens 2002-2003   TEE WITHOUT CARDIOVERSION N/A 06/02/2022   Procedure: TRANSESOPHAGEAL ECHOCARDIOGRAM (TEE);  Surgeon: Jake Bathe, MD;  Location: [facility] ENDOSCOPY;  Service: Cardiovascular;  Laterality: N/A;   TONSILLECTOMY       Social History:  The patient  reports that he quit smoking about 44 years ago. His smoking use included cigarettes. He has never used smokeless tobacco. He reports current alcohol use of about 10.0 standard drinks of alcohol per week. He reports that he does not use drugs.   Family History:  The patient's family history includes Atrial fibrillation in his father; Colon cancer (age of onset: 39) in his mother; Heart attack (age of onset: 10) in his maternal grandfather; Pancreatic cancer in his maternal grandmother; Prostate cancer (age of onset: 65) in his brother; Stomach cancer in his paternal grandfather.    ROS:  Please see the history of present illness. All other systems are reviewed and  Negative to the above problem  except as noted.    PHYSICAL EXAM: VS:  There were no vitals taken for this visit.  GEN: Obese 82 yo  in no acute distress  HEENT: normal  Neck: no JVD, no carotid bruit Cardiac: RRR; no murmurs or gallops,Triv lower extremity edema  Respiratory:  clear to auscultation bilaterally,  GI: soft, nontender, nondistended, + BS  No hepatomegaly  MS: no deformity Moving all extremities   Skin: warm and dry, no rash Neuro:  Strength and sensation are intact Psych: euthymic mood, full affect   EKG:  EKG shows   Sinus bradycardai   59 bpm   RBBB.   LVH     Lipid Panel    Component Value Date/Time   CHOL 125 07/17/2019 0839   TRIG 88 07/17/2019 0839   HDL 61 07/17/2019 0839   CHOLHDL 2.0 07/17/2019 0839   CHOLHDL 3 11/10/2016 0759   VLDL 18.4 11/10/2016 0759    LDLCALC 47 07/17/2019 0839   LDLDIRECT 127.3 09/13/2011 1151      Wt Readings from Last 3 Encounters:  06/30/22 225 lb 3.2 oz (102.2 kg)  06/09/22 228 lb (103.4 kg)  06/02/22 225 lb 1.4 oz (102.1 kg)      ASSESSMENT AND PLAN:  1  PAF s/p TEE/DCCV   Remains in SR  Back to baseline    Keep on same meds   2   CAD   patient with coronary calcifications on CT  Pt without angina   Risk factor modify     3   HL last lipid panel on Zetia and Lipitor  LDL was excellent at 53    4   HTN BP is controlled back on his meds   5   Obesity   Discussseed diet    Limit sugars   F/U next summer   Current medicines are reviewed at length with the patient today.  The patient does not have concerns regarding medicines.  Signed, Dietrich Pates, MD  05/24/2023 8:10 PM    [facility] [address] [place], [place], [place]  [address] Phone: [phone]; Fax: [phone]

## 2023-05-25 ENCOUNTER — Ambulatory Visit: Payer: Medicare Other | Attending: Internal Medicine | Admitting: Internal Medicine

## 2023-05-25 ENCOUNTER — Encounter: Payer: Self-pay | Admitting: Internal Medicine

## 2023-05-25 VITALS — BP 98/58 | HR 56 | Ht 69.0 in | Wt 221.2 lb

## 2023-05-25 DIAGNOSIS — E782 Mixed hyperlipidemia: Secondary | ICD-10-CM

## 2023-05-25 DIAGNOSIS — I480 Paroxysmal atrial fibrillation: Secondary | ICD-10-CM

## 2023-05-25 DIAGNOSIS — O10019 Pre-existing essential hypertension complicating pregnancy, unspecified trimester: Secondary | ICD-10-CM

## 2023-05-25 DIAGNOSIS — Z09 Encounter for follow-up examination after completed treatment for conditions other than malignant neoplasm: Secondary | ICD-10-CM

## 2023-05-25 NOTE — Patient Instructions (Signed)
Medication Instructions:  STOP DILTIAZEM DECREASE LOSARTAN TO 50 MG A DAY  *If you need a refill on your cardiac medications before your next appointment, please call your pharmacy*   Lab Work: CBC, BMET, TSH, HGBA1C, NMR  If you have labs (blood work) drawn today and your tests are completely normal, you will receive your results only by: MyChart Message (if you have MyChart) OR A paper copy in the mail If you have any lab test that is abnormal or we need to change your treatment, we will call you to review the results.   Testing/Procedures:    Follow-Up: At [facility], you and your health needs are our priority.  As part of our continuing mission to provide you with exceptional heart care, we have created designated Provider Care Teams.  These Care Teams include your primary Cardiologist (physician) and Advanced Practice Providers (APPs -  Physician Assistants and Nurse Practitioners) who all work together to provide you with the care you need, when you need it.  We recommend signing up for the patient portal called "MyChart".  Sign up information is provided on this After Visit Summary.  MyChart is used to connect with patients for Virtual Visits (Telemedicine).  Patients are able to view lab/test results, encounter notes, upcoming appointments, etc.  Non-urgent messages can be sent to your provider as well.   To learn more about what you can do with MyChart, go to [email or web address].    Your next appointment:  1-2 WEEKS WITH NURSE FOR BP WITH HIS CUFF AND LOG   8 MONTHS WITH DR Tenny Craw

## 2023-05-26 LAB — CBC
Hematocrit: 39 % (ref 37.5–51.0)
Hemoglobin: 13.1 g/dL (ref 13.0–17.7)
MCH: 31.6 pg (ref 26.6–33.0)
MCHC: 33.6 g/dL (ref 31.5–35.7)
MCV: 94 fL (ref 79–97)
Platelets: 263 10*3/uL (ref 150–450)
RBC: 4.14 x10E6/uL (ref 4.14–5.80)
RDW: 12.2 % (ref 11.6–15.4)
WBC: 6.6 10*3/uL (ref 3.4–10.8)

## 2023-05-26 LAB — NMR, LIPOPROFILE
Cholesterol, Total: 127 mg/dL (ref 100–199)
HDL Particle Number: 33.2 umol/L (ref >=30.5)
HDL-C: 57 mg/dL (ref >39)
LDL Particle Number: 839 nmol/L (ref <1000)
LDL Size: 20.6 nmol (ref >20.5)
LDL-C (NIH Calc): 51 mg/dL (ref 0–99)
LP-IR Score: 37 (ref <=45)
Small LDL Particle Number: 337 nmol/L (ref <=527)
Triglycerides: 101 mg/dL (ref 0–149)

## 2023-05-26 LAB — HEMOGLOBIN A1C
Est. average glucose Bld gHb Est-mCnc: 117 mg/dL
Hgb A1c MFr Bld: 5.7 % — ABNORMAL HIGH (ref 4.8–5.6)

## 2023-05-26 LAB — BASIC METABOLIC PANEL
BUN/Creatinine Ratio: 23 (ref 10–24)
BUN: 33 mg/dL — ABNORMAL HIGH (ref 8–27)
CO2: 19 mmol/L — ABNORMAL LOW (ref 20–29)
Calcium: 8.8 mg/dL (ref 8.6–10.2)
Chloride: 102 mmol/L (ref 96–106)
Creatinine, Ser: 1.41 mg/dL — ABNORMAL HIGH (ref 0.76–1.27)
Glucose: 85 mg/dL (ref 70–99)
Potassium: 4.7 mmol/L (ref 3.5–5.2)
Sodium: 137 mmol/L (ref 134–144)
eGFR: 50 mL/min/{1.73_m2} — ABNORMAL LOW (ref >59)

## 2023-05-26 LAB — TSH: TSH: 1.49 u[IU]/mL (ref 0.450–4.500)

## 2023-06-05 ENCOUNTER — Other Ambulatory Visit: Payer: Self-pay | Admitting: Internal Medicine

## 2023-06-05 ENCOUNTER — Telehealth: Payer: Self-pay | Admitting: Internal Medicine

## 2023-06-05 NOTE — Telephone Encounter (Signed)
Pt states he needs to reschedule his appt with the nurse. Please advise

## 2023-06-05 NOTE — Telephone Encounter (Signed)
Spoke with the patient and rescheduled his nurse visit.

## 2023-06-09 ENCOUNTER — Ambulatory Visit: Payer: Medicare Other

## 2023-06-16 ENCOUNTER — Encounter: Payer: Self-pay | Admitting: *Deleted

## 2023-06-16 ENCOUNTER — Ambulatory Visit: Payer: Medicare Other | Attending: Internal Medicine | Admitting: *Deleted

## 2023-06-16 DIAGNOSIS — I959 Hypotension, unspecified: Secondary | ICD-10-CM

## 2023-06-16 NOTE — Progress Notes (Unsigned)
Pt here today to follow up medication changes as ordered by Dr Tenny Craw,  after having hypotension.  Pt denies any s/s or any further dizziness since medication change.     Nurse Visit   Date of Encounter: 06/16/2023 ID: Russell [name], DOB 12/09/1940, MRN [id number]  PCP:  Gaspar Garbe, MD   [facility] Providers Cardiologist:  Dietrich Pates, MD { Click to update primary MD,subspecialty MD or APP then REFRESH:1}     Visit Details   VS:  BP 114/82 (BP Location: Left Arm, Cuff Size: Normal)   Pulse 63   Ht 5\' 9"  (1.753 m)   Wt 222 lb (100.7 kg)   BMI 32.78 kg/m  , BMI Body mass index is 32.78 kg/m.  Wt Readings from Last 3 Encounters:  06/16/23 222 lb (100.7 kg)  05/25/23 221 lb 3.2 oz (100.3 kg)  06/30/22 225 lb 3.2 oz (102.2 kg)     Reason for visit: blood pressure check Performed today: VS, education, med review Changes (medications, testing, etc.) : none Length of Visit: 15 minutes    Medications Adjustments/Labs and Tests Ordered: No orders of the defined types were placed in this encounter.  No orders of the defined types were placed in this encounter.    Signed, Rocco Serene, RN  06/16/2023 2:30 PM

## 2023-06-16 NOTE — Patient Instructions (Signed)
Medication Instructions:  The current medical regimen is effective;  continue present plan and medications.  *If you need a refill on your cardiac medications before your next appointment, please call your pharmacy*   Follow-Up: At [facility], you and your health needs are our priority.  As part of our continuing mission to provide you with exceptional heart care, we have created designated Provider Care Teams.  These Care Teams include your primary Cardiologist (physician) and Advanced Practice Providers (APPs -  Physician Assistants and Nurse Practitioners) who all work together to provide you with the care you need, when you need it.  We recommend signing up for the patient portal called "MyChart".  Sign up information is provided on this After Visit Summary.  MyChart is used to connect with patients for Virtual Visits (Telemedicine).  Patients are able to view lab/test results, encounter notes, upcoming appointments, etc.  Non-urgent messages can be sent to your provider as well.   To learn more about what you can do with MyChart, go to [email or web address].    Your next appointment:   Follow up as instructed

## 2023-06-20 ENCOUNTER — Other Ambulatory Visit: Payer: Self-pay | Admitting: Internal Medicine

## 2023-07-12 NOTE — Progress Notes (Signed)
B/P controlled

## 2023-07-24 ENCOUNTER — Other Ambulatory Visit: Payer: Self-pay | Admitting: Internal Medicine

## 2023-09-25 ENCOUNTER — Other Ambulatory Visit: Payer: Self-pay | Admitting: Internal Medicine

## 2023-09-25 DIAGNOSIS — I480 Paroxysmal atrial fibrillation: Secondary | ICD-10-CM

## 2023-09-25 NOTE — Telephone Encounter (Signed)
 Prescription refill request for Xarelto  received.  Indication:afib Last office visit:10/24 Weight:100.7  kg Age:83 Scr:1.41 CrCl:58.52  ml/min  Prescription refilled

## 2023-10-23 ENCOUNTER — Other Ambulatory Visit: Payer: Self-pay | Admitting: Internal Medicine

## 2024-01-22 ENCOUNTER — Other Ambulatory Visit: Payer: Self-pay | Admitting: Internal Medicine

## 2024-01-26 ENCOUNTER — Encounter: Payer: Self-pay | Admitting: Internal Medicine

## 2024-04-01 ENCOUNTER — Other Ambulatory Visit: Payer: Self-pay | Admitting: Internal Medicine

## 2024-04-01 DIAGNOSIS — I480 Paroxysmal atrial fibrillation: Secondary | ICD-10-CM

## 2024-04-01 NOTE — Telephone Encounter (Signed)
 Prescription refill request for Xarelto  received.  Indication:afib Last office visit:10/24 Weight:100.7  kg Age:83 Scr:1.41  10/24 CrCl:57.53  ml/min  Prescription refilled

## 2024-05-16 ENCOUNTER — Encounter: Payer: Self-pay | Admitting: Internal Medicine

## 2024-05-16 ENCOUNTER — Ambulatory Visit: Attending: Internal Medicine | Admitting: Internal Medicine

## 2024-05-16 VITALS — BP 110/66 | HR 57 | Ht 69.0 in | Wt 215.0 lb

## 2024-05-16 DIAGNOSIS — I480 Paroxysmal atrial fibrillation: Secondary | ICD-10-CM

## 2024-05-16 DIAGNOSIS — E782 Mixed hyperlipidemia: Secondary | ICD-10-CM

## 2024-05-16 NOTE — Patient Instructions (Signed)
 Medication Instructions:  Your physician recommends that you continue on your current medications as directed. Please refer to the Current Medication list given to you today.  *If you need a refill on your cardiac medications before your next appointment, please call your pharmacy*  Follow-Up: At [facility], you and your health needs are our priority.  As part of our continuing mission to provide you with exceptional heart care, our providers are all part of one team.  This team includes your primary Cardiologist (physician) and Advanced Practice Providers or APPs (Physician Assistants and Nurse Practitioners) who all work together to provide you with the care you need, when you need it.  Your next appointment:   10-11 month(s)  Provider:   Vina Gull, MD  We recommend signing up for the patient portal called MyChart.  Sign up information is provided on this After Visit Summary.  MyChart is used to connect with patients for Virtual Visits (Telemedicine).  Patients are able to view lab/test results, encounter notes, upcoming appointments, etc.  Non-urgent messages can be sent to your provider as well.    To learn more about what you can do with MyChart, go to [email or web address].

## 2024-05-16 NOTE — Progress Notes (Unsigned)
 Cardiology Office Note   Date:  05/16/2024   ID:  Russell [name], Russell [name] 10/01/1940, MRN [id number]  PCP:  Vernadine Charlie ORN, MD  Cardiologist:   Vina Gull, MD   Pt presents for f/u of HTN and PAF    History of Present Illness: Russell [name] is a 83 y.o. male with a history of CAD (by CT), HTN, HL and  paroxysmal atrial fibrillation  (2018  Anticoagulatied, s/p cardioversion)  Echo showed LVEF 55% In Fall 2023 pt had recurrent afib    Underwent TEE/DCCV.  Since I saw him in clinic 1 year ago he says he is doing well  Breathing is good  Denies CP  no dizziness  No palpitations     Walks fairly regularly     I saw the pt in Oct 2024   Current Meds  Medication Sig   atorvastatin  (LIPITOR) 80 MG tablet Take 1 tablet (80 mg total) by mouth daily.   ezetimibe  (ZETIA ) 10 MG tablet TAKE ONE TABLET DAILY IN ADDITION TO ATORVASTATIN    furosemide  (LASIX ) 20 MG tablet TAKE ONE TABLET DAILY   losartan  (COZAAR ) 100 MG tablet Take 50 mg by mouth daily.   metoprolol  succinate (TOPROL -XL) 100 MG 24 hr tablet TAKE ONE TABLET DAILY WITH OR IMMEDIATELY FOLLOWING A MEAL   spironolactone  (ALDACTONE ) 25 MG tablet TAKE ONE TABLET DAILY   XARELTO  20 MG TABS tablet TAKE ONE TABLET DAILY WITH BREAKFAST     Allergies:   Patient has no known allergies.   Past Medical History:  Diagnosis Date   CAD (coronary artery disease)    Diverticulosis 12/2012   colonoscopy   Family hx of colon cancer    mother   Habitual alcohol use    History of prostate cancer    Dr.Peterson   History of skin cancer    basal cell, Dr.Turner    Hx of colonic polyp    Dr Jakie   Hyperlipidemia    NMR 2011, LDL 103 (1410/120),HDL 73, TG 76. LDL goal =<130   Hypertension    Hypertensive encephalopathy    08/01/2009-08/03/2009    Past Surgical History:  Procedure Laterality Date   CARDIOVERSION N/A 06/29/2017   Procedure: CARDIOVERSION;  Surgeon: Blanca Elsie RAMAN, MD;  Location: [facility] ENDOSCOPY;  Service:  Cardiovascular;  Laterality: N/A;   CARDIOVERSION N/A 06/02/2022   Procedure: CARDIOVERSION;  Surgeon: Jeffrie Oneil BROCKS, MD;  Location: [facility] ENDOSCOPY;  Service: Cardiovascular;  Laterality: N/A;   CATARACT EXTRACTION  2010   OD, Dr.Digby   COLONOSCOPY W/ POLYPECTOMY     2000, Neg [id number] & 2014. Dr.Patterson   PROSTATECTOMY  2002   Radiation treatmens 2002-2003   TEE WITHOUT CARDIOVERSION N/A 06/02/2022   Procedure: TRANSESOPHAGEAL ECHOCARDIOGRAM (TEE);  Surgeon: Jeffrie Oneil BROCKS, MD;  Location: [facility] ENDOSCOPY;  Service: Cardiovascular;  Laterality: N/A;   TONSILLECTOMY       Social History:  The patient  reports that he quit smoking about 45 years ago. His smoking use included cigarettes. He has never used smokeless tobacco. He reports current alcohol use of about 10.0 standard drinks of alcohol per week. He reports that he does not use drugs.   Family History:  The patient's family history includes Atrial fibrillation in his father; Colon cancer (age of onset: 39) in his mother; Heart attack (age of onset: 66) in his maternal grandfather; Pancreatic cancer in his maternal grandmother; Prostate cancer (age of onset: 76) in his brother; Stomach cancer in his paternal grandfather.  ROS:  Please see the history of present illness. All other systems are reviewed and  Negative to the above problem except as noted.    PHYSICAL EXAM: VS:  BP 110/66   Pulse (!) 57   Ht 5' 9 (1.753 m)   Wt 215 lb (97.5 kg)   SpO2 98%   BMI 31.75 kg/m     GEN: Obese 83 yo  in no acute distress  HEENT: normal  Neck: no JVD, no carotid bruit Cardiac: RRR; no murmur.  Triv lower extremity edema  Respiratory:  clear to auscultation GI: soft, nontender, No hepatomegaly   EKG:  EKG shows   Sinus bradycardia 57 bpm   RBBB.   LAFB   LVH     Echo   Aug 2018      - Left ventricle: The cavity size was normal. Wall thickness was    increased in a pattern of moderate LVH. Systolic function was    normal. The  estimated ejection fraction was in the range of 50%    to 55%. Wall motion was normal; there were no regional wall    motion abnormalities.  - Mitral valve: Moderately to severely calcified annulus. There was    mild to moderate regurgitation.  - Left atrium: The atrium was severely dilated.  - Right ventricle: The cavity size was mildly dilated. Wall    thickness was normal.  - Right atrium: The atrium was severely dilated.  - Pulmonary arteries: Systolic pressure was mildly to moderately    increased. PA peak pressure: 44 mm Hg (S).  Lipid Panel    Component Value Date/Time   CHOL 125 07/17/2019 0839   TRIG 88 07/17/2019 0839   HDL 61 07/17/2019 0839   CHOLHDL 2.0 07/17/2019 0839   CHOLHDL 3 11/10/2016 0759   VLDL 18.4 11/10/2016 0759   LDLCALC 47 07/17/2019 0839   LDLDIRECT 127.3 09/13/2011 1151      Wt Readings from Last 3 Encounters:  05/16/24 215 lb (97.5 kg)  06/16/23 222 lb (100.7 kg)  05/25/23 221 lb 3.2 oz (100.3 kg)      ASSESSMENT AND PLAN:  1  PAF s/p TEE/DCCV   Remains in SR    2  Hypotension.  Pt's BP is much lower than previous   He denies dizziness    WIll check CBC, TSH, BMET      Pt should stop diltiazem  and cut losartan  in 1/2 He is going out of town today for 1 week   I have asked him to follow BP   Try to get cuff checked     Plan to have him come in when he gets back   Bring BP log and BP cuff in at that time  Stay hydrated   3   CAD   patient with coronary calcifications on CT Denies CP    4   HL Will check lipomed today     5   Obesity  Reviewed diet  Will check A1C  F/U later this winter   Current medicines are reviewed at length with the patient today.  The patient does not have concerns regarding medicines.  Signed, Vina Gull, MD  05/16/2024 11:14 AM    [facility] [address] [place], [place], [place]  [address] Phone: [phone]; Fax: [phone]

## 2024-06-27 ENCOUNTER — Other Ambulatory Visit: Payer: Self-pay | Admitting: Internal Medicine

## 2024-07-06 ENCOUNTER — Other Ambulatory Visit: Payer: Self-pay | Admitting: Internal Medicine

## 2024-07-29 ENCOUNTER — Other Ambulatory Visit: Payer: Self-pay | Admitting: Internal Medicine
